# Patient Record
Sex: Male | Born: 1965 | Race: White | Hispanic: No | Marital: Married | Smoking: Never smoker
Health system: Southern US, Community
[De-identification: ages and names within clinical notes are randomized; demographics above are authoritative.]

## PROBLEM LIST (undated history)

## (undated) DIAGNOSIS — I10 Essential (primary) hypertension: Secondary | ICD-10-CM

---

## 1998-08-20 ENCOUNTER — Ambulatory Visit (HOSPITAL_COMMUNITY): Admission: RE | Admit: 1998-08-20 | Discharge: 1998-08-20 | Payer: Self-pay | Admitting: Family Medicine

## 1998-08-20 ENCOUNTER — Encounter: Payer: Self-pay | Admitting: Family Medicine

## 2006-10-26 ENCOUNTER — Encounter: Admission: RE | Admit: 2006-10-26 | Discharge: 2006-10-26 | Payer: Self-pay | Admitting: Nephrology

## 2008-02-02 ENCOUNTER — Emergency Department (HOSPITAL_COMMUNITY): Admission: EM | Admit: 2008-02-02 | Discharge: 2008-02-02 | Payer: Self-pay | Admitting: Family Medicine

## 2008-05-17 IMAGING — CR DG WRIST*R* EXAM
1 series · 3 of 3 positions shown · non-contrast
Comparison: NONE

CLINICAL DATA: Pain and swelling 

RIGHT WRIST

[Series 1: view not recorded · 0.17mm/px · 3 of 3 slices shown]
[im 1/3]
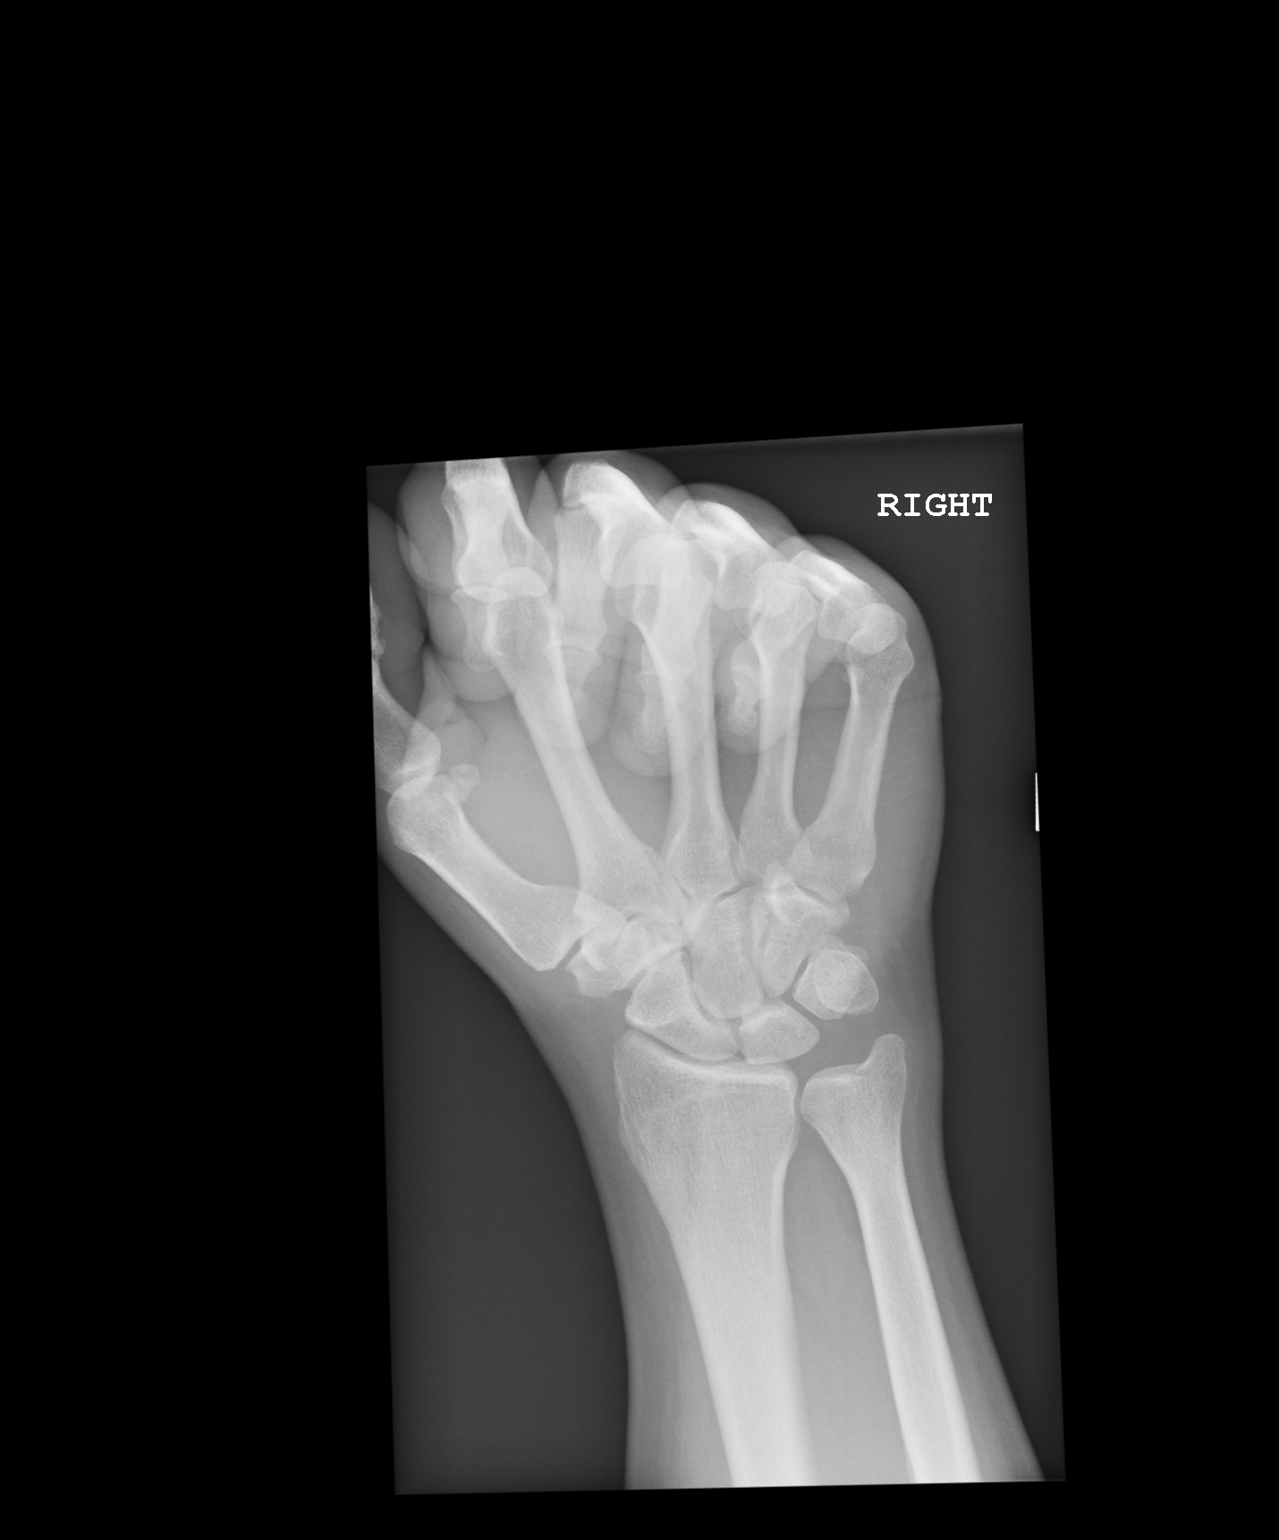
[im 2/3]
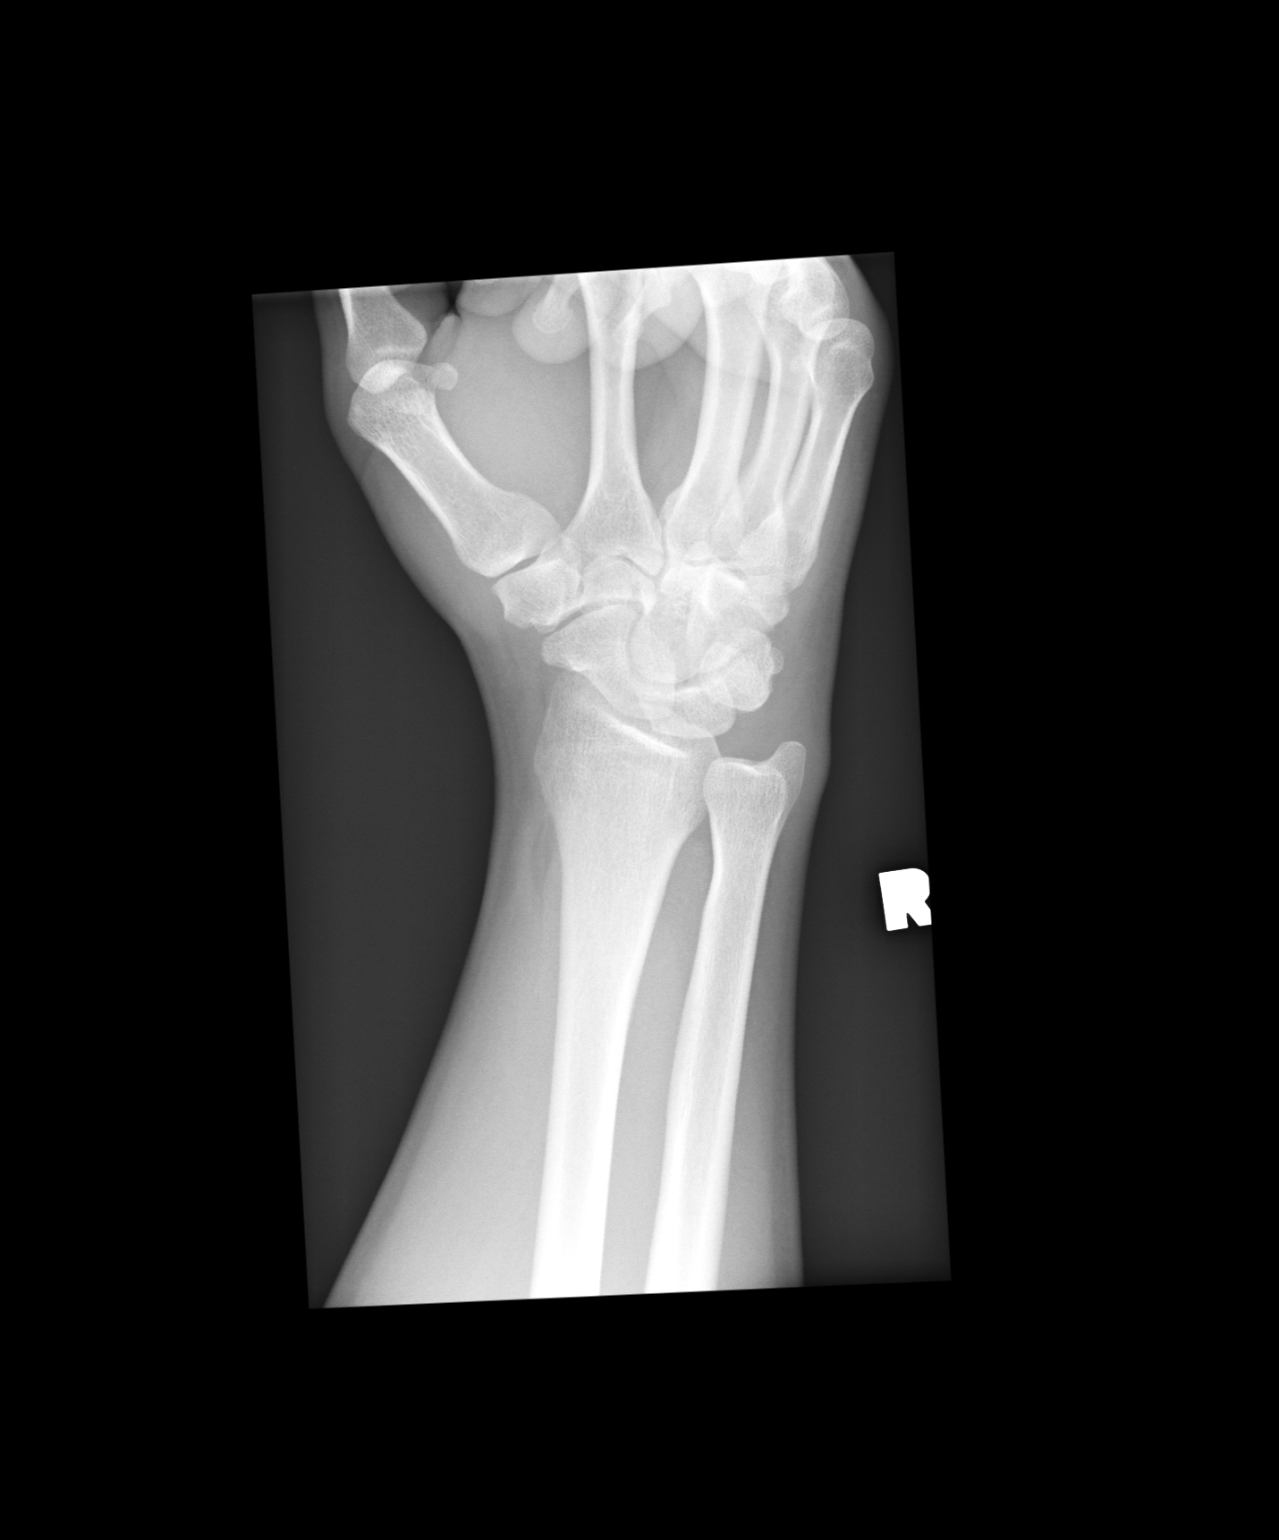
[im 3/3]
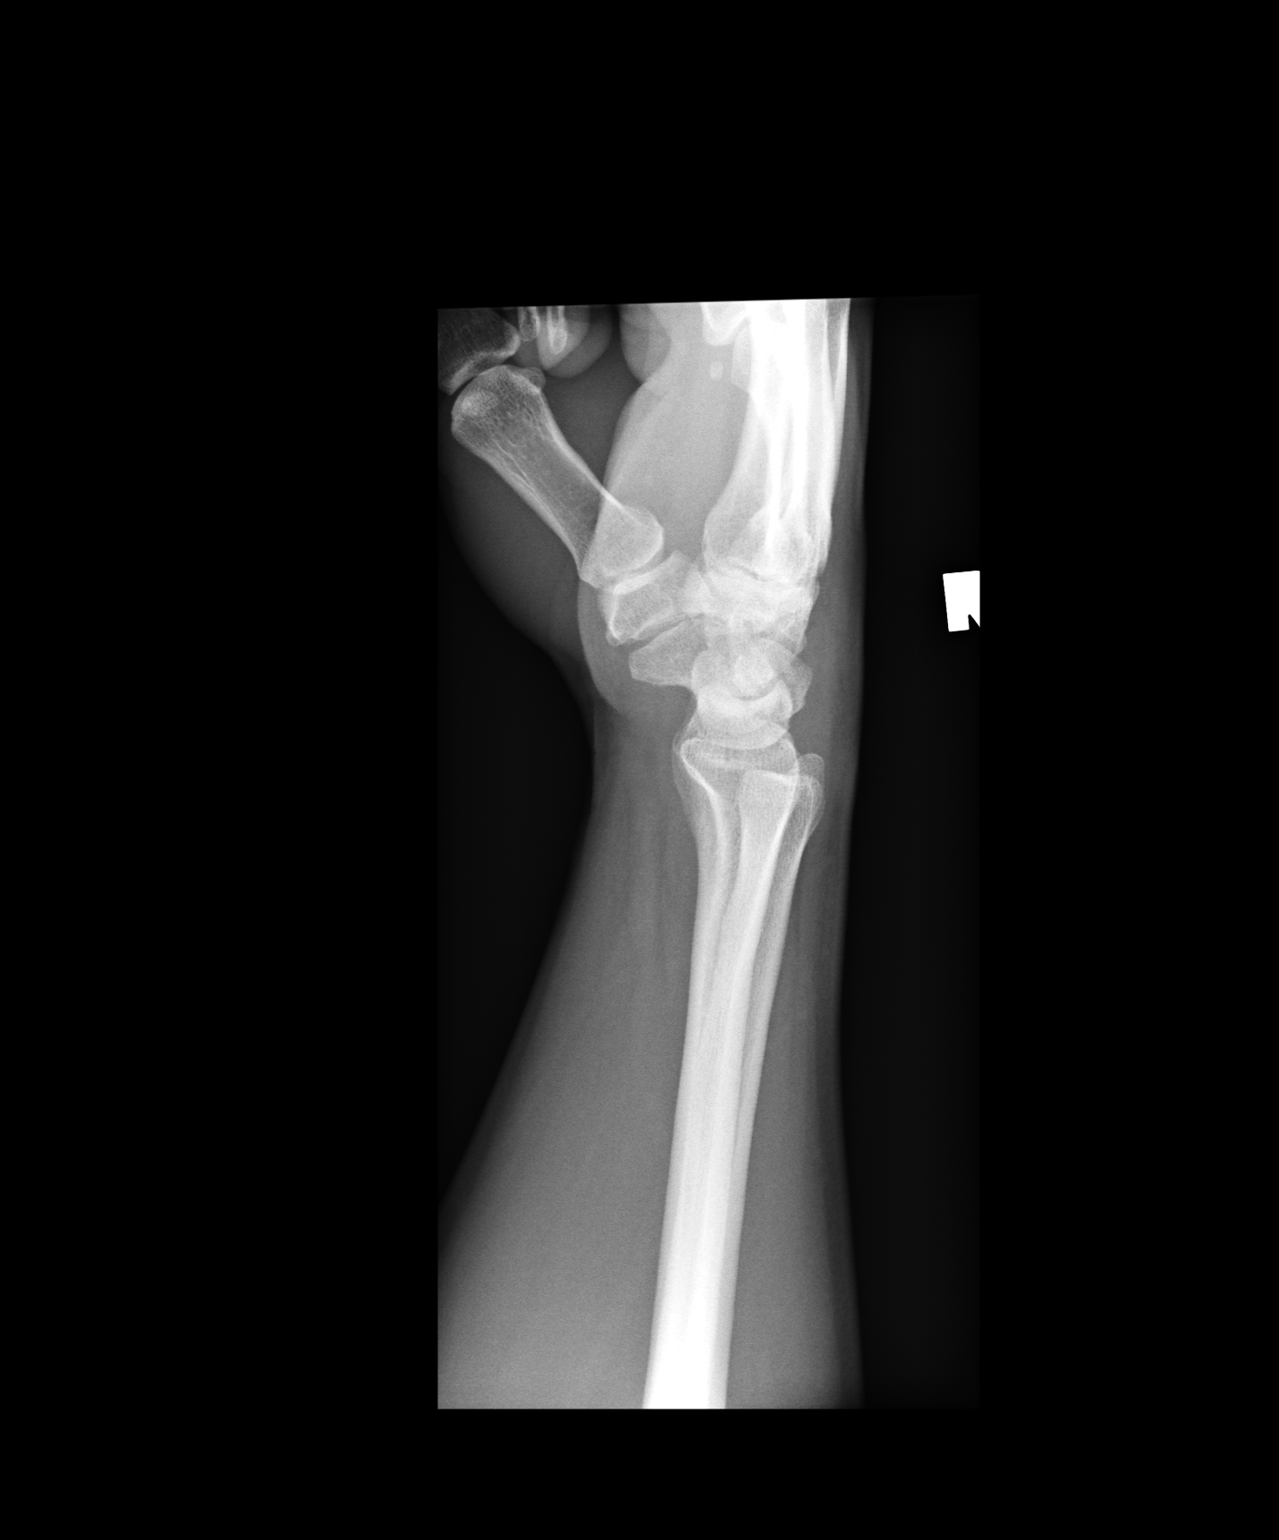

[3 of 3 positions shown; findings below may reference images not displayed]

FINDINGS: The articular spaces are within normal limits. there is 
no evidence of acute fracture or dislocation. No radiopaque 
foreign bodies are seen.
IMPRESSION: Right wrist radiographs are within normal limits. 
Date: 09/21/2006  Tran Date: 09/21/2006 DAS  [REDACTED]

## 2010-06-19 HISTORY — PX: HERNIA REPAIR: SHX51

## 2022-01-14 ENCOUNTER — Emergency Department (HOSPITAL_COMMUNITY): Payer: No Typology Code available for payment source

## 2022-01-14 ENCOUNTER — Inpatient Hospital Stay (HOSPITAL_COMMUNITY): Payer: No Typology Code available for payment source

## 2022-01-14 ENCOUNTER — Inpatient Hospital Stay (HOSPITAL_COMMUNITY)
Admission: EM | Admit: 2022-01-14 | Discharge: 2022-01-25 | DRG: 957 | Disposition: A | Discharge status: Rehabilitation Facility | Payer: No Typology Code available for payment source | Attending: Surgery | Admitting: Surgery

## 2022-01-14 DIAGNOSIS — R102 Pelvic and perineal pain: Secondary | ICD-10-CM | POA: Diagnosis present

## 2022-01-14 DIAGNOSIS — N179 Acute kidney failure, unspecified: Secondary | ICD-10-CM | POA: Diagnosis not present

## 2022-01-14 DIAGNOSIS — D62 Acute posthemorrhagic anemia: Secondary | ICD-10-CM | POA: Diagnosis present

## 2022-01-14 DIAGNOSIS — S52372A Galeazzi's fracture of left radius, initial encounter for closed fracture: Secondary | ICD-10-CM | POA: Diagnosis present

## 2022-01-14 DIAGNOSIS — S329XXA Fracture of unspecified parts of lumbosacral spine and pelvis, initial encounter for closed fracture: Secondary | ICD-10-CM | POA: Diagnosis present

## 2022-01-14 DIAGNOSIS — Z23 Encounter for immunization: Secondary | ICD-10-CM | POA: Diagnosis not present

## 2022-01-14 DIAGNOSIS — I129 Hypertensive chronic kidney disease with stage 1 through stage 4 chronic kidney disease, or unspecified chronic kidney disease: Secondary | ICD-10-CM | POA: Diagnosis present

## 2022-01-14 DIAGNOSIS — S3210XA Unspecified fracture of sacrum, initial encounter for closed fracture: Secondary | ICD-10-CM

## 2022-01-14 DIAGNOSIS — N189 Chronic kidney disease, unspecified: Secondary | ICD-10-CM | POA: Diagnosis present

## 2022-01-14 DIAGNOSIS — K5900 Constipation, unspecified: Secondary | ICD-10-CM | POA: Diagnosis not present

## 2022-01-14 DIAGNOSIS — S32810D Multiple fractures of pelvis with stable disruption of pelvic ring, subsequent encounter for fracture with routine healing: Secondary | ICD-10-CM | POA: Diagnosis not present

## 2022-01-14 DIAGNOSIS — F32A Depression, unspecified: Secondary | ICD-10-CM | POA: Diagnosis not present

## 2022-01-14 DIAGNOSIS — S334XXA Traumatic rupture of symphysis pubis, initial encounter: Secondary | ICD-10-CM

## 2022-01-14 DIAGNOSIS — I10 Essential (primary) hypertension: Secondary | ICD-10-CM | POA: Diagnosis not present

## 2022-01-14 DIAGNOSIS — Z472 Encounter for removal of internal fixation device: Secondary | ICD-10-CM | POA: Diagnosis not present

## 2022-01-14 DIAGNOSIS — S3983XA Other specified injuries of pelvis, initial encounter: Secondary | ICD-10-CM | POA: Diagnosis not present

## 2022-01-14 DIAGNOSIS — R339 Retention of urine, unspecified: Secondary | ICD-10-CM | POA: Diagnosis not present

## 2022-01-14 DIAGNOSIS — S32810A Multiple fractures of pelvis with stable disruption of pelvic ring, initial encounter for closed fracture: Secondary | ICD-10-CM | POA: Diagnosis present

## 2022-01-14 DIAGNOSIS — Z885 Allergy status to narcotic agent status: Secondary | ICD-10-CM | POA: Diagnosis not present

## 2022-01-14 DIAGNOSIS — R000 Tachycardia, unspecified: Secondary | ICD-10-CM | POA: Diagnosis not present

## 2022-01-14 DIAGNOSIS — R578 Other shock: Secondary | ICD-10-CM | POA: Diagnosis present

## 2022-01-14 DIAGNOSIS — S32409S Unspecified fracture of unspecified acetabulum, sequela: Secondary | ICD-10-CM | POA: Diagnosis not present

## 2022-01-14 DIAGNOSIS — S62102A Fracture of unspecified carpal bone, left wrist, initial encounter for closed fracture: Secondary | ICD-10-CM

## 2022-01-14 DIAGNOSIS — S32599A Other specified fracture of unspecified pubis, initial encounter for closed fracture: Secondary | ICD-10-CM

## 2022-01-14 DIAGNOSIS — S3289XA Fracture of other parts of pelvis, initial encounter for closed fracture: Secondary | ICD-10-CM | POA: Diagnosis not present

## 2022-01-14 DIAGNOSIS — S52692A Other fracture of lower end of left ulna, initial encounter for closed fracture: Secondary | ICD-10-CM | POA: Diagnosis present

## 2022-01-14 DIAGNOSIS — S32121A Minimally displaced Zone II fracture of sacrum, initial encounter for closed fracture: Secondary | ICD-10-CM | POA: Diagnosis present

## 2022-01-14 DIAGNOSIS — D72829 Elevated white blood cell count, unspecified: Secondary | ICD-10-CM | POA: Diagnosis not present

## 2022-01-14 DIAGNOSIS — K5901 Slow transit constipation: Secondary | ICD-10-CM | POA: Diagnosis not present

## 2022-01-14 DIAGNOSIS — T07XXXA Unspecified multiple injuries, initial encounter: Secondary | ICD-10-CM | POA: Diagnosis not present

## 2022-01-14 DIAGNOSIS — G8918 Other acute postprocedural pain: Secondary | ICD-10-CM | POA: Diagnosis not present

## 2022-01-14 DIAGNOSIS — E785 Hyperlipidemia, unspecified: Secondary | ICD-10-CM | POA: Diagnosis not present

## 2022-01-14 DIAGNOSIS — S36892A Contusion of other intra-abdominal organs, initial encounter: Secondary | ICD-10-CM | POA: Diagnosis present

## 2022-01-14 DIAGNOSIS — S3282XA Multiple fractures of pelvis without disruption of pelvic ring, initial encounter for closed fracture: Secondary | ICD-10-CM | POA: Diagnosis present

## 2022-01-14 DIAGNOSIS — E876 Hypokalemia: Secondary | ICD-10-CM | POA: Diagnosis not present

## 2022-01-14 DIAGNOSIS — Z880 Allergy status to penicillin: Secondary | ICD-10-CM

## 2022-01-14 HISTORY — DX: Essential (primary) hypertension: I10

## 2022-01-14 LAB — COMPREHENSIVE METABOLIC PANEL
ALT: 65 U/L — ABNORMAL HIGH (ref 0–44)
AST: 72 U/L — ABNORMAL HIGH (ref 15–41)
Albumin: 3.7 g/dL (ref 3.5–5.0)
Alkaline Phosphatase: 55 U/L (ref 38–126)
Anion gap: 11 (ref 5–15)
BUN: 23 mg/dL — ABNORMAL HIGH (ref 6–20)
CO2: 24 mmol/L (ref 22–32)
Calcium: 9.1 mg/dL (ref 8.9–10.3)
Chloride: 103 mmol/L (ref 98–111)
Creatinine, Ser: 2.13 mg/dL — ABNORMAL HIGH (ref 0.61–1.24)
GFR, Estimated: 36 mL/min — ABNORMAL LOW (ref >60)
Glucose, Bld: 134 mg/dL — ABNORMAL HIGH (ref 70–99)
Potassium: 4.5 mmol/L (ref 3.5–5.1)
Sodium: 138 mmol/L (ref 135–145)
Total Bilirubin: 1.2 mg/dL (ref 0.3–1.2)
Total Protein: 6.8 g/dL (ref 6.5–8.1)

## 2022-01-14 LAB — I-STAT CHEM 8, ED
BUN: 30 mg/dL — ABNORMAL HIGH (ref 6–20)
Calcium, Ion: 1.15 mmol/L (ref 1.15–1.40)
Chloride: 104 mmol/L (ref 98–111)
Creatinine, Ser: 2.2 mg/dL — ABNORMAL HIGH (ref 0.61–1.24)
Glucose, Bld: 128 mg/dL — ABNORMAL HIGH (ref 70–99)
HCT: 46 % (ref 39.0–52.0)
Hemoglobin: 15.6 g/dL (ref 13.0–17.0)
Potassium: 4.3 mmol/L (ref 3.5–5.1)
Sodium: 140 mmol/L (ref 135–145)
TCO2: 26 mmol/L (ref 22–32)

## 2022-01-14 LAB — CBC
HCT: 46.9 % (ref 39.0–52.0)
Hemoglobin: 16.1 g/dL (ref 13.0–17.0)
MCH: 31.3 pg (ref 26.0–34.0)
MCHC: 34.3 g/dL (ref 30.0–36.0)
MCV: 91.2 fL (ref 80.0–100.0)
Platelets: 307 10*3/uL (ref 150–400)
RBC: 5.14 MIL/uL (ref 4.22–5.81)
RDW: 13.7 % (ref 11.5–15.5)
WBC: 12.9 10*3/uL — ABNORMAL HIGH (ref 4.0–10.5)
nRBC: 0 % (ref 0.0–0.2)

## 2022-01-14 LAB — URINALYSIS, ROUTINE W REFLEX MICROSCOPIC
Bacteria, UA: NONE SEEN
Bilirubin Urine: NEGATIVE
Glucose, UA: NEGATIVE mg/dL
Ketones, ur: NEGATIVE mg/dL
Leukocytes,Ua: NEGATIVE
Nitrite: NEGATIVE
Protein, ur: 30 mg/dL — AB
Specific Gravity, Urine: 1.03 (ref 1.005–1.030)
pH: 6 (ref 5.0–8.0)

## 2022-01-14 LAB — LACTIC ACID, PLASMA: Lactic Acid, Venous: 3.3 mmol/L (ref 0.5–1.9)

## 2022-01-14 LAB — PROTIME-INR
INR: 1.1 (ref 0.8–1.2)
Prothrombin Time: 13.9 seconds (ref 11.4–15.2)

## 2022-01-14 LAB — ETHANOL: Alcohol, Ethyl (B): <10 mg/dL (ref <10)

## 2022-01-14 LAB — MRSA NEXT GEN BY PCR, NASAL: MRSA by PCR Next Gen: NOT DETECTED

## 2022-01-14 LAB — PREPARE RBC (CROSSMATCH)

## 2022-01-14 MED ORDER — IOHEXOL 300 MG/ML  SOLN
50.0000 mL | Freq: Once | INTRAMUSCULAR | Status: AC | PRN
  Administered 2022-01-14: 50 mL

## 2022-01-14 MED ORDER — CHLORHEXIDINE GLUCONATE CLOTH 2 % EX PADS
6.0000 | MEDICATED_PAD | Freq: Every day | CUTANEOUS | Status: DC
  Administered 2022-01-15 – 2022-01-25 (×9): 6 via TOPICAL

## 2022-01-14 MED ORDER — TETANUS-DIPHTH-ACELL PERTUSSIS 5-2.5-18.5 LF-MCG/0.5 IM SUSY
0.5000 mL | PREFILLED_SYRINGE | Freq: Once | INTRAMUSCULAR | Status: AC
Start: 2022-01-14 — End: 2022-01-14
  Administered 2022-01-14: 0.5 mL via INTRAMUSCULAR

## 2022-01-14 MED ORDER — DEXTROSE-NACL 5-0.45 % IV SOLN
INTRAVENOUS | Status: DC
Start: 2022-01-14 — End: 2022-01-16

## 2022-01-14 MED ORDER — TRANEXAMIC ACID-NACL 1000-0.7 MG/100ML-% IV SOLN
1000.0000 mg | Freq: Once | INTRAVENOUS | Status: AC
  Administered 2022-01-14: 1000 mg via INTRAVENOUS

## 2022-01-14 MED ORDER — FENTANYL CITRATE PF 50 MCG/ML IJ SOSY
50.0000 ug | PREFILLED_SYRINGE | INTRAMUSCULAR | Status: DC | PRN
  Administered 2022-01-14 – 2022-01-15 (×3): 50 ug via INTRAVENOUS
  Administered 2022-01-15 (×3): 100 ug via INTRAVENOUS
  Administered 2022-01-15: 50 ug via INTRAVENOUS
  Administered 2022-01-15 (×6): 100 ug via INTRAVENOUS
  Administered 2022-01-15: 50 ug via INTRAVENOUS
  Administered 2022-01-16: 100 ug via INTRAVENOUS
  Filled 2022-01-14: qty 1
  Filled 2022-01-14 (×5): qty 2
  Filled 2022-01-14: qty 1
  Filled 2022-01-14 (×2): qty 2
  Filled 2022-01-14 (×2): qty 1
  Filled 2022-01-14 (×2): qty 2
  Filled 2022-01-14: qty 1
  Filled 2022-01-14: qty 2

## 2022-01-14 MED ORDER — LIDOCAINE HCL 2 % IJ SOLN
INTRAMUSCULAR | Status: AC
  Filled 2022-01-14: qty 20

## 2022-01-14 MED ORDER — FENTANYL CITRATE PF 50 MCG/ML IJ SOSY
PREFILLED_SYRINGE | INTRAMUSCULAR | Status: AC
  Administered 2022-01-14: 12.5 ug via INTRAVENOUS
  Filled 2022-01-14: qty 1

## 2022-01-14 MED ORDER — METOPROLOL TARTRATE 5 MG/5ML IV SOLN: 5.0000 mg | Freq: Four times a day (QID) | INTRAVENOUS | Status: DC | PRN

## 2022-01-14 MED ORDER — ONDANSETRON HCL 4 MG/2ML IJ SOLN
4.0000 mg | Freq: Four times a day (QID) | INTRAMUSCULAR | Status: DC | PRN
  Administered 2022-01-14 – 2022-01-18 (×5): 4 mg via INTRAVENOUS
  Filled 2022-01-14 (×5): qty 2

## 2022-01-14 MED ORDER — LIDOCAINE HCL (PF) 1 % IJ SOLN
INTRAMUSCULAR | Status: AC
  Filled 2022-01-14: qty 30

## 2022-01-14 MED ORDER — MORPHINE SULFATE (PF) 2 MG/ML IV SOLN
2.0000 mg | INTRAVENOUS | Status: DC | PRN
  Administered 2022-01-14: 4 mg via INTRAVENOUS
  Filled 2022-01-14: qty 2

## 2022-01-14 MED ORDER — LACTATED RINGERS IV BOLUS
1000.0000 mL | Freq: Once | INTRAVENOUS | Status: AC
  Administered 2022-01-14: 1000 mL via INTRAVENOUS

## 2022-01-14 MED ORDER — FENTANYL CITRATE (PF) 100 MCG/2ML IJ SOLN
INTRAMUSCULAR | Status: AC
  Administered 2022-01-14: 100 ug
  Filled 2022-01-14: qty 2

## 2022-01-14 MED ORDER — FENTANYL CITRATE PF 50 MCG/ML IJ SOSY: 12.5000 ug | PREFILLED_SYRINGE | Freq: Once | INTRAMUSCULAR | Status: AC

## 2022-01-14 MED ORDER — ACETAMINOPHEN 325 MG PO TABS
650.0000 mg | ORAL_TABLET | ORAL | Status: DC | PRN
  Administered 2022-01-15: 650 mg via ORAL
  Filled 2022-01-14: qty 2

## 2022-01-14 MED ORDER — CLINDAMYCIN PHOSPHATE 600 MG/50ML IV SOLN
600.0000 mg | Freq: Once | INTRAVENOUS | Status: AC
  Administered 2022-01-14: 600 mg via INTRAVENOUS
  Filled 2022-01-14: qty 50

## 2022-01-14 MED ORDER — PROCHLORPERAZINE EDISYLATE 10 MG/2ML IJ SOLN
10.0000 mg | Freq: Four times a day (QID) | INTRAMUSCULAR | Status: DC | PRN
  Administered 2022-01-14: 10 mg via INTRAVENOUS
  Filled 2022-01-14 (×2): qty 2

## 2022-01-14 MED ORDER — SODIUM CHLORIDE 0.9% IV SOLUTION: Freq: Once | INTRAVENOUS | Status: DC

## 2022-01-14 MED ORDER — ONDANSETRON 4 MG PO TBDP: 4.0000 mg | ORAL_TABLET | Freq: Four times a day (QID) | ORAL | Status: DC | PRN

## 2022-01-14 MED ORDER — IOHEXOL 350 MG/ML SOLN
100.0000 mL | Freq: Once | INTRAVENOUS | Status: AC | PRN
  Administered 2022-01-14: 100 mL via INTRAVENOUS

## 2022-01-14 MED ORDER — PANTOPRAZOLE SODIUM 40 MG IV SOLR
40.0000 mg | Freq: Every day | INTRAVENOUS | Status: DC
  Administered 2022-01-15: 40 mg via INTRAVENOUS
  Filled 2022-01-14: qty 10

## 2022-01-14 MED ORDER — DOCUSATE SODIUM 100 MG PO CAPS
100.0000 mg | ORAL_CAPSULE | Freq: Two times a day (BID) | ORAL | Status: DC
Start: 2022-01-14 — End: 2022-01-25
  Administered 2022-01-16 – 2022-01-25 (×18): 100 mg via ORAL
  Filled 2022-01-14 (×18): qty 1

## 2022-01-14 MED ORDER — OXYCODONE HCL 5 MG PO TABS
5.0000 mg | ORAL_TABLET | ORAL | Status: DC | PRN
  Administered 2022-01-15 – 2022-01-16 (×2): 5 mg via ORAL
  Filled 2022-01-14 (×2): qty 1

## 2022-01-14 MED ORDER — ONDANSETRON HCL 4 MG/2ML IJ SOLN
INTRAMUSCULAR | Status: AC
  Administered 2022-01-14: 4 mg via INTRAVENOUS
  Filled 2022-01-14: qty 2

## 2022-01-14 MED ORDER — PANTOPRAZOLE SODIUM 40 MG PO TBEC: 40.0000 mg | DELAYED_RELEASE_TABLET | Freq: Every day | ORAL | Status: DC

## 2022-01-14 NOTE — Progress Notes (Signed)
Attempted to get report from ED at 1951.
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
## 2022-01-14 NOTE — ED Notes (Signed)
Pt SpO2 89% on RA, pt placed on 2L via nasal cannula. BP decreased to 89 systolic.

## 2022-01-14 NOTE — Progress Notes (Signed)
Trauma Event Note    Obtained new orders for pain/nausea (D/C morphine, add PRN fentanyl and compazine). Current plan to keep in pelvic binder until Monday. Maintain NPO overnight d/t nausea this evening.     Norman Crawford  Trauma Response RN  Please call TRN at [phone] for further assistance.
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
## 2022-01-14 NOTE — Progress Notes (Signed)
Trauma response RN on unit. She has been communicating with MD. This RN relayed to her that patient is in pain, but currently refusing IV morphine d/t nausea. Messaged relayed to MD per Trauma response RN. Also requested another antiemetic. Will continue to monitor.
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
## 2022-01-14 NOTE — Progress Notes (Signed)
Orthopedic Tech Progress Note Patient Details:  Norman Crawford Apr 01, 1966 [id number]  Ortho Devices Type of Ortho Device: Sugartong splint Ortho Device/Splint Location: lue  I assisted ortho dr with splint. Post Interventions Patient Tolerated: Well Instructions Provided: Care of device  Trinna Post 01/14/2022, 8:34 PM

## 2022-01-14 NOTE — Progress Notes (Signed)
Upon admission to unit, patient's underwear entrapped in pelvic binder. Seen coming out top and bottom of binder. Unable to retrieve without undoing binder.
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
## 2022-01-14 NOTE — Progress Notes (Signed)
Pt had episode of n/v earlier States he feels better in his abd  BP 96/66   Pulse 89   Temp 98 F (36.7 C) (Axillary)   Resp 17   Ht 5\' 9"  (1.753 m)   Wt 115.7 kg   SpO2 96%   BMI 37.66 kg/m   Resting comfortably, alert, ox4 Significant other at [facility] Obese wm, obese abd, soft, nontender, no guarding/rebounding  We are monitoring his abdomen given mesentery hematomas. No overt signs of intestinal injury on admit ct.  Abd exam reassuring right now D/w pt potential for exp lap should he have increasing abd pain to r/o intestinal injury Discussed that likely blood in abd is irritating intestines and may give rise to ileus/peritoneal irritation.   Npo xcept ice chips  No neck pain; no pain on palpation of c spine, FROM c spine without pain - dc c collar and c spine precautions  [facility]. Mary Sella, MD, FACS General, Bariatric, & Minimally Invasive Surgery [facility], [facility]

## 2022-01-15 ENCOUNTER — Inpatient Hospital Stay: Payer: Self-pay

## 2022-01-15 ENCOUNTER — Encounter (HOSPITAL_COMMUNITY): Payer: Self-pay | Admitting: *Deleted

## 2022-01-15 LAB — CBC
HCT: 40.1 % (ref 39.0–52.0)
HCT: 39 % (ref 39.0–52.0)
HCT: 37.5 % — ABNORMAL LOW (ref 39.0–52.0)
Hemoglobin: 13.8 g/dL (ref 13.0–17.0)
Hemoglobin: 13.3 g/dL (ref 13.0–17.0)
Hemoglobin: 13 g/dL (ref 13.0–17.0)
MCH: 31.2 pg (ref 26.0–34.0)
MCH: 30.6 pg (ref 26.0–34.0)
MCH: 31.6 pg (ref 26.0–34.0)
MCHC: 34.4 g/dL (ref 30.0–36.0)
MCHC: 34.1 g/dL (ref 30.0–36.0)
MCHC: 34.7 g/dL (ref 30.0–36.0)
MCV: 90.5 fL (ref 80.0–100.0)
MCV: 89.9 fL (ref 80.0–100.0)
MCV: 91.2 fL (ref 80.0–100.0)
Platelets: 166 10*3/uL (ref 150–400)
Platelets: 145 10*3/uL — ABNORMAL LOW (ref 150–400)
Platelets: 148 10*3/uL — ABNORMAL LOW (ref 150–400)
RBC: 4.43 MIL/uL (ref 4.22–5.81)
RBC: 4.34 MIL/uL (ref 4.22–5.81)
RBC: 4.11 MIL/uL — ABNORMAL LOW (ref 4.22–5.81)
RDW: 14.5 % (ref 11.5–15.5)
RDW: 14.4 % (ref 11.5–15.5)
RDW: 14.3 % (ref 11.5–15.5)
WBC: 11.5 10*3/uL — ABNORMAL HIGH (ref 4.0–10.5)
WBC: 12.6 10*3/uL — ABNORMAL HIGH (ref 4.0–10.5)
WBC: 10.5 10*3/uL (ref 4.0–10.5)
nRBC: 0 % (ref 0.0–0.2)
nRBC: 0 % (ref 0.0–0.2)
nRBC: 0 % (ref 0.0–0.2)

## 2022-01-15 LAB — TYPE AND SCREEN
ABO/RH(D): A POS
Antibody Screen: NEGATIVE
Unit division: 0
Unit division: 0

## 2022-01-15 LAB — BPAM RBC
Blood Product Expiration Date: 202308232359
Blood Product Expiration Date: 202308252359
ISSUE DATE / TIME: 202307291741
ISSUE DATE / TIME: 202307291759
Unit Type and Rh: 5100
Unit Type and Rh: 5100

## 2022-01-15 LAB — ABO/RH: ABO/RH(D): A POS

## 2022-01-15 LAB — BLOOD PRODUCT ORDER (VERBAL) VERIFICATION

## 2022-01-15 MED ORDER — SODIUM CHLORIDE 0.9 % IV SOLN
25.0000 mg | Freq: Four times a day (QID) | INTRAVENOUS | Status: DC | PRN
  Administered 2022-01-15: 25 mg via INTRAVENOUS
  Filled 2022-01-15 (×2): qty 1

## 2022-01-15 MED ORDER — SODIUM CHLORIDE 0.9% FLUSH: 10.0000 mL | INTRAVENOUS | Status: DC | PRN

## 2022-01-15 MED ORDER — SODIUM CHLORIDE 0.9% FLUSH
10.0000 mL | Freq: Two times a day (BID) | INTRAVENOUS | Status: DC
  Administered 2022-01-15 – 2022-01-18 (×7): 10 mL
  Administered 2022-01-19: 20 mL
  Administered 2022-01-19 – 2022-01-24 (×10): 10 mL
  Administered 2022-01-24: 20 mL
  Administered 2022-01-25: 10 mL

## 2022-01-15 NOTE — Progress Notes (Signed)
Dr. Andrey Campanile paged. Phlebotomy has been unable to draw labs. Per phlebotomy, patient is a very difficult draw.  Per phlebotomy, with the amount they have tried they are no longer allowed to attempt to draw labs from the patient for 24 hours. Dr. Andrey Campanile made aware.  Verbal orders to have IV team come and place a PICC line.
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
## 2022-01-15 NOTE — Progress Notes (Signed)
OT Cancellation Note  Patient Details Name: Norman Crawford MRN: [id number] DOB: 1965/12/10   Cancelled Treatment:    Reason Eval/Treat Not Completed: Patient not medically ready (Plan for surgery Monday, OT evaluation to f/u when appropriate)  [name] 01/15/2022, 7:24 AM
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
## 2022-01-15 NOTE — Progress Notes (Signed)
PT Cancellation Note  Patient Details Name: Norman Crawford MRN: [id number] DOB: 19-Mar-1966   Cancelled Treatment:    Reason Eval/Treat Not Completed: Patient not medically ready (note plans for surgery on Monday. Will assess when pt is medically appropriate to participate in physical therapy evaluation).  Lillia Pauls, PT, DPT Acute Rehabilitation Services Office [phone]    [name] 01/15/2022, 7:21 AM

## 2022-01-15 NOTE — Progress Notes (Signed)
Peripherally Inserted Central Catheter Placement  The IV Nurse has discussed with the patient and/or persons authorized to consent for the patient, the purpose of this procedure and the potential benefits and risks involved with this procedure.  The benefits include less needle sticks, lab draws from the catheter, and the patient may be discharged home with the catheter. Risks include, but not limited to, infection, bleeding, blood clot (thrombus formation), and puncture of an artery; nerve damage and irregular heartbeat and possibility to perform a PICC exchange if needed/ordered by physician.  Alternatives to this procedure were also discussed.  Bard Power PICC patient education guide, fact sheet on infection prevention and patient information card has been provided to patient /or left at bedside.    PICC Placement Documentation  PICC Double Lumen 01/15/22 Right Cephalic 41 cm 0 cm (Active)  Indication for Insertion or Continuance of Line Limited venous access - need for IV therapy >5 days (PICC only) 01/15/22 1059  Exposed Catheter (cm) 0 cm 01/15/22 1059  Site Assessment Clean, Dry, Intact 01/15/22 1059  Lumen #1 Status Flushed;Saline locked;Blood return noted 01/15/22 1059  Lumen #2 Status Flushed;Saline locked;Blood return noted 01/15/22 1059  Dressing Type Transparent;Securing device 01/15/22 1059  Dressing Status Antimicrobial disc in place 01/15/22 1059  Safety Lock Not Applicable 01/15/22 1059  Line Care Connections checked and tightened 01/15/22 1059  Line Adjustment (NICU/IV Team Only) No 01/15/22 1059  Dressing Intervention New dressing 01/15/22 1059  Dressing Change Due 01/21/22 01/15/22 1059       Gennell How 01/15/2022, 11:01 AM

## 2022-01-16 ENCOUNTER — Inpatient Hospital Stay (HOSPITAL_COMMUNITY): Payer: No Typology Code available for payment source

## 2022-01-16 ENCOUNTER — Inpatient Hospital Stay (HOSPITAL_COMMUNITY): Payer: No Typology Code available for payment source | Admitting: Anesthesiology

## 2022-01-16 ENCOUNTER — Other Ambulatory Visit: Payer: Self-pay

## 2022-01-16 ENCOUNTER — Encounter (HOSPITAL_COMMUNITY): Payer: Self-pay

## 2022-01-16 ENCOUNTER — Encounter (HOSPITAL_COMMUNITY): Admission: EM | Disposition: A | Discharge status: Rehabilitation Facility | Payer: Self-pay | Source: Home / Self Care

## 2022-01-16 DIAGNOSIS — S3983XA Other specified injuries of pelvis, initial encounter: Secondary | ICD-10-CM | POA: Diagnosis not present

## 2022-01-16 DIAGNOSIS — S36892A Contusion of other intra-abdominal organs, initial encounter: Secondary | ICD-10-CM

## 2022-01-16 DIAGNOSIS — S52372A Galeazzi's fracture of left radius, initial encounter for closed fracture: Secondary | ICD-10-CM

## 2022-01-16 DIAGNOSIS — S334XXA Traumatic rupture of symphysis pubis, initial encounter: Secondary | ICD-10-CM

## 2022-01-16 HISTORY — PX: ORIF PELVIC FRACTURE: SHX2128

## 2022-01-16 LAB — POCT I-STAT, CHEM 8
BUN: 25 mg/dL — ABNORMAL HIGH (ref 6–20)
Calcium, Ion: 1.13 mmol/L — ABNORMAL LOW (ref 1.15–1.40)
Chloride: 100 mmol/L (ref 98–111)
Creatinine, Ser: 1.9 mg/dL — ABNORMAL HIGH (ref 0.61–1.24)
Glucose, Bld: 106 mg/dL — ABNORMAL HIGH (ref 70–99)
HCT: 32 % — ABNORMAL LOW (ref 39.0–52.0)
Hemoglobin: 10.9 g/dL — ABNORMAL LOW (ref 13.0–17.0)
Potassium: 3.9 mmol/L (ref 3.5–5.1)
Sodium: 136 mmol/L (ref 135–145)
TCO2: 24 mmol/L (ref 22–32)

## 2022-01-16 LAB — BASIC METABOLIC PANEL
Anion gap: 5 (ref 5–15)
BUN: 21 mg/dL — ABNORMAL HIGH (ref 6–20)
CO2: 24 mmol/L (ref 22–32)
Calcium: 7.6 mg/dL — ABNORMAL LOW (ref 8.9–10.3)
Chloride: 103 mmol/L (ref 98–111)
Creatinine, Ser: 1.49 mg/dL — ABNORMAL HIGH (ref 0.61–1.24)
GFR, Estimated: 55 mL/min — ABNORMAL LOW (ref >60)
Glucose, Bld: 408 mg/dL — ABNORMAL HIGH (ref 70–99)
Potassium: 3.4 mmol/L — ABNORMAL LOW (ref 3.5–5.1)
Sodium: 132 mmol/L — ABNORMAL LOW (ref 135–145)

## 2022-01-16 LAB — CBC
HCT: 36.3 % — ABNORMAL LOW (ref 39.0–52.0)
Hemoglobin: 12.2 g/dL — ABNORMAL LOW (ref 13.0–17.0)
MCH: 31.3 pg (ref 26.0–34.0)
MCHC: 33.6 g/dL (ref 30.0–36.0)
MCV: 93.1 fL (ref 80.0–100.0)
Platelets: 138 10*3/uL — ABNORMAL LOW (ref 150–400)
RBC: 3.9 MIL/uL — ABNORMAL LOW (ref 4.22–5.81)
RDW: 14.3 % (ref 11.5–15.5)
WBC: 9.3 10*3/uL (ref 4.0–10.5)
nRBC: 0 % (ref 0.0–0.2)

## 2022-01-16 LAB — GLUCOSE, CAPILLARY: Glucose-Capillary: 116 mg/dL — ABNORMAL HIGH (ref 70–99)

## 2022-01-16 LAB — SURGICAL PCR SCREEN
MRSA, PCR: NEGATIVE
Staphylococcus aureus: POSITIVE — AB

## 2022-01-16 LAB — HIV ANTIBODY (ROUTINE TESTING W REFLEX): HIV Screen 4th Generation wRfx: NONREACTIVE

## 2022-01-16 SURGERY — OPEN REDUCTION INTERNAL FIXATION (ORIF) PELVIC FRACTURE
Anesthesia: General | Laterality: Bilateral

## 2022-01-16 MED ORDER — KETAMINE HCL 10 MG/ML IJ SOLN
INTRAMUSCULAR | Status: DC | PRN
  Administered 2022-01-16: 30 mg via INTRAVENOUS
  Administered 2022-01-16: 20 mg via INTRAVENOUS

## 2022-01-16 MED ORDER — LIDOCAINE 2% (20 MG/ML) 5 ML SYRINGE
INTRAMUSCULAR | Status: DC | PRN
  Administered 2022-01-16: 80 mg via INTRAVENOUS

## 2022-01-16 MED ORDER — ONDANSETRON HCL 4 MG/2ML IJ SOLN
INTRAMUSCULAR | Status: DC | PRN
  Administered 2022-01-16: 4 mg via INTRAVENOUS

## 2022-01-16 MED ORDER — FENTANYL CITRATE (PF) 100 MCG/2ML IJ SOLN: 25.0000 ug | INTRAMUSCULAR | Status: DC | PRN

## 2022-01-16 MED ORDER — FENTANYL CITRATE (PF) 250 MCG/5ML IJ SOLN
INTRAMUSCULAR | Status: DC | PRN
  Administered 2022-01-16 (×2): 50 ug via INTRAVENOUS
  Administered 2022-01-16: 100 ug via INTRAVENOUS

## 2022-01-16 MED ORDER — CEFAZOLIN SODIUM-DEXTROSE 2-4 GM/100ML-% IV SOLN
2.0000 g | INTRAVENOUS | Status: AC
  Administered 2022-01-16: 2 g via INTRAVENOUS

## 2022-01-16 MED ORDER — FENTANYL CITRATE (PF) 250 MCG/5ML IJ SOLN
INTRAMUSCULAR | Status: AC
  Filled 2022-01-16: qty 5

## 2022-01-16 MED ORDER — KETAMINE HCL 50 MG/5ML IJ SOSY
PREFILLED_SYRINGE | INTRAMUSCULAR | Status: AC
  Filled 2022-01-16: qty 5

## 2022-01-16 MED ORDER — ACETAMINOPHEN 500 MG PO TABS
1000.0000 mg | ORAL_TABLET | Freq: Four times a day (QID) | ORAL | Status: DC
  Administered 2022-01-16 – 2022-01-25 (×33): 1000 mg via ORAL
  Filled 2022-01-16 (×31): qty 2

## 2022-01-16 MED ORDER — ALBUMIN HUMAN 5 % IV SOLN: INTRAVENOUS | Status: DC | PRN

## 2022-01-16 MED ORDER — ROCURONIUM BROMIDE 10 MG/ML (PF) SYRINGE
PREFILLED_SYRINGE | INTRAVENOUS | Status: DC | PRN
  Administered 2022-01-16: 100 mg via INTRAVENOUS
  Administered 2022-01-16: 20 mg via INTRAVENOUS

## 2022-01-16 MED ORDER — KETOROLAC TROMETHAMINE 15 MG/ML IJ SOLN
30.0000 mg | Freq: Four times a day (QID) | INTRAMUSCULAR | Status: AC
  Administered 2022-01-16 – 2022-01-21 (×18): 30 mg via INTRAVENOUS
  Filled 2022-01-16 (×18): qty 2

## 2022-01-16 MED ORDER — PROPOFOL 10 MG/ML IV BOLUS
INTRAVENOUS | Status: AC
  Filled 2022-01-16: qty 20

## 2022-01-16 MED ORDER — PROPOFOL 10 MG/ML IV BOLUS
INTRAVENOUS | Status: DC | PRN
  Administered 2022-01-16: 100 mg via INTRAVENOUS

## 2022-01-16 MED ORDER — PHENYLEPHRINE 80 MCG/ML (10ML) SYRINGE FOR IV PUSH (FOR BLOOD PRESSURE SUPPORT)
PREFILLED_SYRINGE | INTRAVENOUS | Status: DC | PRN
  Administered 2022-01-16: 80 ug via INTRAVENOUS
  Administered 2022-01-16: 160 ug via INTRAVENOUS
  Administered 2022-01-16: 80 ug via INTRAVENOUS

## 2022-01-16 MED ORDER — OXYCODONE HCL 5 MG/5ML PO SOLN
5.0000 mg | ORAL | Status: DC | PRN
  Administered 2022-01-17: 10 mg via ORAL
  Filled 2022-01-16: qty 10

## 2022-01-16 MED ORDER — CHLORHEXIDINE GLUCONATE 0.12 % MT SOLN: 15.0000 mL | Freq: Once | OROMUCOSAL | Status: AC

## 2022-01-16 MED ORDER — CEFAZOLIN SODIUM-DEXTROSE 2-4 GM/100ML-% IV SOLN
2.0000 g | Freq: Three times a day (TID) | INTRAVENOUS | Status: AC
  Administered 2022-01-16 – 2022-01-17 (×3): 2 g via INTRAVENOUS
  Filled 2022-01-16 (×3): qty 100

## 2022-01-16 MED ORDER — MIDAZOLAM HCL 2 MG/2ML IJ SOLN
INTRAMUSCULAR | Status: AC
  Filled 2022-01-16: qty 2

## 2022-01-16 MED ORDER — SUGAMMADEX SODIUM 200 MG/2ML IV SOLN
INTRAVENOUS | Status: DC | PRN
  Administered 2022-01-16: 200 mg via INTRAVENOUS

## 2022-01-16 MED ORDER — CHLORHEXIDINE GLUCONATE 4 % EX LIQD: 60.0000 mL | Freq: Once | CUTANEOUS | Status: DC

## 2022-01-16 MED ORDER — POTASSIUM CHLORIDE 10 MEQ/100ML IV SOLN
10.0000 meq | INTRAVENOUS | Status: DC
  Filled 2022-01-16 (×2): qty 100

## 2022-01-16 MED ORDER — POVIDONE-IODINE 10 % EX SWAB
2.0000 | Freq: Once | CUTANEOUS | Status: AC
Start: 2022-01-16 — End: 2022-01-16
  Administered 2022-01-16: 2 via TOPICAL

## 2022-01-16 MED ORDER — VANCOMYCIN HCL 1000 MG IV SOLR
INTRAVENOUS | Status: AC
Start: 2022-01-16 — End: ?
  Filled 2022-01-16: qty 20

## 2022-01-16 MED ORDER — LACTATED RINGERS IV SOLN: INTRAVENOUS | Status: DC

## 2022-01-16 MED ORDER — MIDAZOLAM HCL 2 MG/2ML IJ SOLN
INTRAMUSCULAR | Status: DC | PRN
  Administered 2022-01-16 (×2): 1 mg via INTRAVENOUS

## 2022-01-16 MED ORDER — VANCOMYCIN HCL 1000 MG IV SOLR
INTRAVENOUS | Status: DC | PRN
  Administered 2022-01-16: 1000 mg

## 2022-01-16 MED ORDER — TRANEXAMIC ACID-NACL 1000-0.7 MG/100ML-% IV SOLN
1000.0000 mg | INTRAVENOUS | Status: AC
  Administered 2022-01-16: 1000 mg via INTRAVENOUS

## 2022-01-16 MED ORDER — 0.9 % SODIUM CHLORIDE (POUR BTL) OPTIME
TOPICAL | Status: DC | PRN
  Administered 2022-01-16: 1000 mL

## 2022-01-16 MED ORDER — LACTATED RINGERS IV SOLN
INTRAVENOUS | Status: DC
Start: 2022-01-16 — End: 2022-01-16

## 2022-01-16 MED ORDER — SODIUM CHLORIDE 0.9 % IV BOLUS: 1000.0000 mL | Freq: Once | INTRAVENOUS | Status: DC

## 2022-01-16 MED ORDER — MORPHINE SULFATE (PF) 2 MG/ML IV SOLN
2.0000 mg | INTRAVENOUS | Status: DC | PRN
  Administered 2022-01-16: 2 mg via INTRAVENOUS
  Administered 2022-01-16 – 2022-01-17 (×3): 4 mg via INTRAVENOUS
  Administered 2022-01-17: 2 mg via INTRAVENOUS
  Administered 2022-01-18: 4 mg via INTRAVENOUS
  Administered 2022-01-18: 2 mg via INTRAVENOUS
  Filled 2022-01-16: qty 1
  Filled 2022-01-16 (×3): qty 2
  Filled 2022-01-16: qty 1
  Filled 2022-01-16: qty 2
  Filled 2022-01-16: qty 1

## 2022-01-16 MED ORDER — CHLORHEXIDINE GLUCONATE 0.12 % MT SOLN
OROMUCOSAL | Status: AC
  Administered 2022-01-16: 15 mL via OROMUCOSAL
  Filled 2022-01-16: qty 15

## 2022-01-16 MED ORDER — ATENOLOL 50 MG PO TABS
100.0000 mg | ORAL_TABLET | Freq: Once | ORAL | Status: AC
  Administered 2022-01-16: 100 mg via ORAL
  Filled 2022-01-16: qty 2

## 2022-01-16 MED ORDER — DEXAMETHASONE SODIUM PHOSPHATE 10 MG/ML IJ SOLN
INTRAMUSCULAR | Status: DC | PRN
  Administered 2022-01-16: 10 mg via INTRAVENOUS

## 2022-01-16 MED ORDER — METHOCARBAMOL 500 MG PO TABS
1000.0000 mg | ORAL_TABLET | Freq: Three times a day (TID) | ORAL | Status: DC
  Administered 2022-01-16 – 2022-01-25 (×27): 1000 mg via ORAL
  Filled 2022-01-16 (×27): qty 2

## 2022-01-16 MED ORDER — OXYCODONE HCL 5 MG/5ML PO SOLN
5.0000 mg | ORAL | Status: DC | PRN
  Administered 2022-01-16 (×2): 10 mg
  Filled 2022-01-16 (×2): qty 10

## 2022-01-16 MED ORDER — ORAL CARE MOUTH RINSE: 15.0000 mL | Freq: Once | OROMUCOSAL | Status: AC

## 2022-01-16 MED ORDER — PHENYLEPHRINE HCL-NACL 20-0.9 MG/250ML-% IV SOLN
INTRAVENOUS | Status: DC | PRN
  Administered 2022-01-16: 25 ug/min via INTRAVENOUS

## 2022-01-16 MED ORDER — TRANEXAMIC ACID-NACL 1000-0.7 MG/100ML-% IV SOLN
INTRAVENOUS | Status: AC
  Filled 2022-01-16: qty 100

## 2022-01-16 MED ORDER — CEFAZOLIN SODIUM-DEXTROSE 2-4 GM/100ML-% IV SOLN
INTRAVENOUS | Status: AC
  Filled 2022-01-16: qty 100

## 2022-01-16 SURGICAL SUPPLY — 69 items
ADH SKN CLS APL DERMABOND .7 (GAUZE/BANDAGES/DRESSINGS) ×2
APL PRP STRL LF DISP 70% ISPRP (MISCELLANEOUS) ×1
BAG COUNTER SPONGE SURGICOUNT (BAG) ×2 IMPLANT
BAG SPNG CNTER NS LX DISP (BAG) ×1
BIT DRILL 2.5X300 (BIT) IMPLANT
BIT DRILL CANN 4.5MM (BIT) IMPLANT
BLADE CLIPPER SURG (BLADE) IMPLANT
CHLORAPREP W/TINT 26 (MISCELLANEOUS) ×2 IMPLANT
DERMABOND ADVANCED (GAUZE/BANDAGES/DRESSINGS) ×4
DERMABOND ADVANCED .7 DNX12 (GAUZE/BANDAGES/DRESSINGS) ×2 IMPLANT
DRAIN CHANNEL 15F RND FF W/TCR (WOUND CARE) IMPLANT
DRAPE C-ARM 42X72 X-RAY (DRAPES) ×2 IMPLANT
DRAPE C-ARMOR (DRAPES) ×2 IMPLANT
DRAPE HALF SHEET 40X57 (DRAPES) ×4 IMPLANT
DRAPE INCISE IOBAN 66X45 STRL (DRAPES) ×4 IMPLANT
DRAPE SURG 17X23 STRL (DRAPES) ×12 IMPLANT
DRAPE U-SHAPE 47X51 STRL (DRAPES) ×2 IMPLANT
DRESSING MEPILEX FLEX 4X4 (GAUZE/BANDAGES/DRESSINGS) IMPLANT
DRILL BIT 2.5X300 (BIT) ×4
DRILL BIT CANN 4.5MM (BIT) ×2
DRSG MEPILEX BORDER 4X4 (GAUZE/BANDAGES/DRESSINGS) ×4 IMPLANT
DRSG MEPILEX BORDER 4X8 (GAUZE/BANDAGES/DRESSINGS) ×3 IMPLANT
DRSG MEPILEX FLEX 4X4 (GAUZE/BANDAGES/DRESSINGS) ×2
ELECT REM PT RETURN 9FT ADLT (ELECTROSURGICAL) ×2
ELECTRODE REM PT RTRN 9FT ADLT (ELECTROSURGICAL) ×1 IMPLANT
EVACUATOR SILICONE 100CC (DRAIN) IMPLANT
GLOVE BIO SURGEON STRL SZ 6.5 (GLOVE) ×6 IMPLANT
GLOVE BIO SURGEON STRL SZ7.5 (GLOVE) ×8 IMPLANT
GLOVE BIOGEL PI IND STRL 6.5 (GLOVE) ×1 IMPLANT
GLOVE BIOGEL PI IND STRL 7.5 (GLOVE) ×1 IMPLANT
GLOVE BIOGEL PI INDICATOR 6.5 (GLOVE) ×2
GLOVE BIOGEL PI INDICATOR 7.5 (GLOVE) ×2
GOWN STRL REUS W/ TWL LRG LVL3 (GOWN DISPOSABLE) ×2 IMPLANT
GOWN STRL REUS W/TWL LRG LVL3 (GOWN DISPOSABLE) ×4
GUIDEWIRE 2.0MM (WIRE) ×3 IMPLANT
GUIDEWIRE THREADED 2.8MM (WIRE) ×1 IMPLANT
KIT BASIN OR (CUSTOM PROCEDURE TRAY) ×2 IMPLANT
KIT TURNOVER KIT B (KITS) ×2 IMPLANT
MANIFOLD NEPTUNE II (INSTRUMENTS) ×2 IMPLANT
NS IRRIG 1000ML POUR BTL (IV SOLUTION) ×4 IMPLANT
PACK TOTAL JOINT (CUSTOM PROCEDURE TRAY) ×2 IMPLANT
PACK UNIVERSAL I (CUSTOM PROCEDURE TRAY) ×2 IMPLANT
PAD ARMBOARD 7.5X6 YLW CONV (MISCELLANEOUS) ×4 IMPLANT
PLATE PUBLIC SYMPHOSIS 3.5 (Plate) ×1 IMPLANT
SCREW CANN LOCK FT 7.3X170 (Screw) ×1 IMPLANT
SCREW LOCK CORT ST 3.5X26 (Screw) ×1 IMPLANT
SCREW LOCK CORT ST 3.5X28 (Screw) ×4 IMPLANT
SCREW LOCK CORT ST 3.5X34 (Screw) ×1 IMPLANT
SCREW LOCK CORT ST 3.5X36 (Screw) ×1 IMPLANT
SCREW LOCK CORT ST 3.5X38 (Screw) ×1 IMPLANT
SCREW PELVIC CORT ST 3.5X50 (Screw) ×1 IMPLANT
SCREW PELVIC CORT ST 3.5X60 (Screw) ×1 IMPLANT
SPONGE T-LAP 18X18 ~~LOC~~+RFID (SPONGE) IMPLANT
STAPLER VISISTAT 35W (STAPLE) ×2 IMPLANT
SUCTION FRAZIER HANDLE 10FR (MISCELLANEOUS) ×2
SUCTION TUBE FRAZIER 10FR DISP (MISCELLANEOUS) ×1 IMPLANT
SUT MNCRL AB 3-0 PS2 18 (SUTURE) ×2 IMPLANT
SUT MON AB 2-0 CT1 36 (SUTURE) ×2 IMPLANT
SUT VIC AB 0 CT1 27 (SUTURE) ×6
SUT VIC AB 0 CT1 27XBRD ANBCTR (SUTURE) ×2 IMPLANT
SUT VIC AB 1 CT1 18XCR BRD 8 (SUTURE) IMPLANT
SUT VIC AB 1 CT1 8-18 (SUTURE)
SUT VIC AB 2-0 CT1 27 (SUTURE) ×4
SUT VIC AB 2-0 CT1 TAPERPNT 27 (SUTURE) ×2 IMPLANT
TOWEL GREEN STERILE (TOWEL DISPOSABLE) ×4 IMPLANT
TOWEL GREEN STERILE FF (TOWEL DISPOSABLE) ×2 IMPLANT
TRAY FOLEY MTR SLVR 16FR STAT (SET/KITS/TRAYS/PACK) IMPLANT
WASHER FOR 5.0 SCREWS (Washer) ×1 IMPLANT
WATER STERILE IRR 1000ML POUR (IV SOLUTION) ×8 IMPLANT
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
## 2022-01-16 NOTE — Transfer of Care (Signed)
Immediate Anesthesia Transfer of Care Note  Patient: Norman Crawford  Procedure(s) Performed: OPEN REDUCTION INTERNAL FIXATION (ORIF) PELVIC FRACTURE (Bilateral)  Patient Location: PACU  Anesthesia Type:General  Level of Consciousness: awake, alert  and oriented  Airway & Oxygen Therapy: Patient Spontanous Breathing and Patient connected to face mask oxygen  Post-op Assessment: Report given to RN and Post -op Vital signs reviewed and stable  Post vital signs: Reviewed and stable  Last Vitals:  Vitals Value Taken Time  BP 99/66 01/16/22 1438  Temp 37.2 C 01/16/22 1438  Pulse 79 01/16/22 1442  Resp 15 01/16/22 1442  SpO2 93 % 01/16/22 1442  Vitals shown include unvalidated device data.  Last Pain:  Vitals:   01/16/22 1143  TempSrc:   PainSc: 8       Patients Stated Pain Goal: 3 (01/16/22 1143)  Complications: No notable events documented.

## 2022-01-16 NOTE — H&P (View-Only) (Signed)
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 iterative reconstruction technique. COMPARISON:  None Available. FINDINGS: Brain: No evidence of acute infarction, hemorrhage, hydrocephalus, extra-axial collection or mass lesion/mass effect. Vascular: No hyperdense vessel or unexpected calcification. Skull: Normal. Negative for fracture or focal lesion. Sinuses/Orbits: Paranasal sinuses and mastoid air cells are clear. Other: None. IMPRESSION: No acute  intracranial abnormalities. Critical Value/emergent results were called by telephone at the time of interpretation on 01/14/2022 at 6:09 pm to provider Kinsinger, who verbally acknowledged these results. Electronically Signed   By: Burman Nieves M.D.   On: 01/14/2022 18:14   DG Wrist Complete Left  Result Date: 01/14/2022 CLINICAL DATA:  Trauma EXAM: LEFT WRIST - COMPLETE 3+ VIEW COMPARISON:  None Available. FINDINGS: Limited single view of left wrist and left hand are submitted for review. Severely comminuted fracture is seen in  the distal shaft and distal end of left radius. There is possible extension of fracture lines to the articular surface. There is fracture in the ulnar styloid. There is widening of space between distal radius and ulna suggesting diastasis of inferior radioulnar joint. Bony spurs are seen in first carpometacarpal joint. IMPRESSION: Limited single portable view. Severely comminuted, displaced and angulated fracture is seen in the distal shaft and distal end of left radius. There is possible extension of fracture to the articular surface in the distal radius. Fracture is seen in the ulnar styloid. There is diastasis in inferior radioulnar joint. Electronically Signed   By: Ernie Avena M.D.   On: 01/14/2022 18:01   DG Pelvis Portable  Result Date: 01/14/2022 CLINICAL DATA:  Trauma EXAM: PORTABLE PELVIS 1-2 VIEWS COMPARISON:  None Available. FINDINGS: Technically limited study. Left iliac bone and left proximal femur are not included in their entirety. There are artifacts outside the patient's body which are obscuring the neck of the right femur. There is abnormal widening of space between the pubic bones measuring 4.2 cm. There is possible widening of left SI joint space. IMPRESSION: Technically limited study. There is marked diastasis in the pubic symphysis. There is possible widening of left SI joint space. Follow-up CT as clinically warranted should be considered. Electronically Signed   By: Ernie Avena M.D.   On: 01/14/2022 17:59   DG Chest Port 1 View  Result Date: 01/14/2022 CLINICAL DATA:  Trauma EXAM: PORTABLE CHEST 1 VIEW COMPARISON:  None Available. FINDINGS: Cardiac size is within normal limits. There is poor inspiration. Lung fields are clear of any infiltrate or pulmonary edema. There is no significant pleural effusion or pneumothorax. There is deformity in the posterior right fourth rib. IMPRESSION: There are no signs of pulmonary edema or focal pulmonary consolidation. There is no  significant pleural effusion or pneumothorax. Deformity without radiolucent line seen in the posterior right fourth rib may suggest old fracture. Electronically Signed   By: Ernie Avena M.D.   On: 01/14/2022 17:57    Review of Systems  HENT:  Negative for ear discharge, ear pain, hearing loss and tinnitus.   Eyes:  Negative for photophobia and pain.  Respiratory:  Negative for cough and shortness of breath.   Cardiovascular:  Negative for chest pain.  Gastrointestinal:  Negative for abdominal pain, nausea and vomiting.  Genitourinary:  Negative for dysuria, flank pain, frequency and urgency.  Musculoskeletal:  Positive for arthralgias (Left arm, pelvis). Negative for back pain, myalgias and neck pain.  Neurological:  Negative for dizziness and headaches.  Hematological:  Does not bruise/bleed easily.  Psychiatric/Behavioral:  The patient is not nervous/anxious.    Blood pressure (!) 158/94, pulse (!) 110, temperature 98.9 F (37.2 C), temperature source Oral, resp. rate 20, height 5\' 9"  (1.753 m), weight 115.7 kg, SpO2 94 %. Physical Exam Constitutional:      General: He is not in acute distress.    Appearance: He is well-developed. He is not diaphoretic.  HENT:     Head: Normocephalic and atraumatic.  Eyes:     General: No scleral icterus.       Right eye: No discharge.        Left eye: No discharge.     Conjunctiva/sclera: Conjunctivae normal.  Cardiovascular:     Rate and Rhythm: Normal rate and regular rhythm.  Pulmonary:     Effort: Pulmonary effort is normal. No respiratory distress.  Musculoskeletal:     Cervical back: Normal range of motion.     Comments: Left shoulder, elbow, wrist, digits- no skin wounds, sugar tong splint in place, no instability, no blocks to motion  Sens  Ax/R/M/U intact  Mot   Ax/ R/ PIN/ M/ AIN/ U intact  Rad 2+  BLE No traumatic wounds, ecchymosis, or rash  Nontender  No knee or ankle effusion  Knee stable to varus/ valgus and  anterior/posterior stress  Sens DPN, SPN, TN intact  Motor EHL, ext, flex, evers 5/5  DP 2+, PT 1+, No significant edema  Skin:    General: Skin is warm and dry.  Neurological:     Mental Status: He is alert.  Psychiatric:        Mood and Affect: Mood normal.        Behavior: Behavior normal.     Assessment/Plan: Left distal radius fx -- Plan ORIF Wednesday with Dr. Doreatha Martin. Open book pelvic fxs -- Plan ORIF with SI screw fixation today with Dr. Doreatha Martin. Please keep NPO.    Lisette Abu, PA-C Orthopedic Surgery [phone] 01/16/2022, 9:04 AM

## 2022-01-16 NOTE — Consult Note (Signed)
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 use of iterative reconstruction technique. COMPARISON:  None Available. FINDINGS: Brain: No evidence of acute infarction, hemorrhage, hydrocephalus, extra-axial collection or mass lesion/mass effect. Vascular: No hyperdense vessel or unexpected calcification. Skull: Normal. Negative for fracture or focal lesion. Sinuses/Orbits: Paranasal sinuses and mastoid air cells are clear. Other: None. IMPRESSION: No acute  intracranial abnormalities. Critical Value/emergent results were called by telephone at the time of interpretation on 01/14/2022 at 6:09 pm to provider Kinsinger, who verbally acknowledged these results. Electronically Signed   By: Burman Nieves M.D.   On: 01/14/2022 18:14   DG Wrist Complete Left  Result Date: 01/14/2022 CLINICAL DATA:  Trauma EXAM: LEFT WRIST - COMPLETE 3+ VIEW COMPARISON:  None Available. FINDINGS: Limited single view of left wrist and left hand are submitted for review. Severely comminuted fracture is seen in  the distal shaft and distal end of left radius. There is possible extension of fracture lines to the articular surface. There is fracture in the ulnar styloid. There is widening of space between distal radius and ulna suggesting diastasis of inferior radioulnar joint. Bony spurs are seen in first carpometacarpal joint. IMPRESSION: Limited single portable view. Severely comminuted, displaced and angulated fracture is seen in the distal shaft and distal end of left radius. There is possible extension of fracture to the articular surface in the distal radius. Fracture is seen in the ulnar styloid. There is diastasis in inferior radioulnar joint. Electronically Signed   By: Ernie Avena M.D.   On: 01/14/2022 18:01   DG Pelvis Portable  Result Date: 01/14/2022 CLINICAL DATA:  Trauma EXAM: PORTABLE PELVIS 1-2 VIEWS COMPARISON:  None Available. FINDINGS: Technically limited study. Left iliac bone and left proximal femur are not included in their entirety. There are artifacts outside the patient's body which are obscuring the neck of the right femur. There is abnormal widening of space between the pubic bones measuring 4.2 cm. There is possible widening of left SI joint space. IMPRESSION: Technically limited study. There is marked diastasis in the pubic symphysis. There is possible widening of left SI joint space. Follow-up CT as clinically warranted should be considered. Electronically Signed   By: Ernie Avena M.D.   On: 01/14/2022 17:59   DG Chest Port 1 View  Result Date: 01/14/2022 CLINICAL DATA:  Trauma EXAM: PORTABLE CHEST 1 VIEW COMPARISON:  None Available. FINDINGS: Cardiac size is within normal limits. There is poor inspiration. Lung fields are clear of any infiltrate or pulmonary edema. There is no significant pleural effusion or pneumothorax. There is deformity in the posterior right fourth rib. IMPRESSION: There are no signs of pulmonary edema or focal pulmonary consolidation. There is no  significant pleural effusion or pneumothorax. Deformity without radiolucent line seen in the posterior right fourth rib may suggest old fracture. Electronically Signed   By: Ernie Avena M.D.   On: 01/14/2022 17:57    Review of Systems  HENT:  Negative for ear discharge, ear pain, hearing loss and tinnitus.   Eyes:  Negative for photophobia and pain.  Respiratory:  Negative for cough and shortness of breath.   Cardiovascular:  Negative for chest pain.  Gastrointestinal:  Negative for abdominal pain, nausea and vomiting.  Genitourinary:  Negative for dysuria, flank pain, frequency and urgency.  Musculoskeletal:  Positive for arthralgias (Left arm, pelvis). Negative for back pain, myalgias and neck pain.  Neurological:  Negative for dizziness and headaches.  Hematological:  Does not bruise/bleed easily.  Psychiatric/Behavioral:  The patient is not nervous/anxious.    Blood pressure (!) 158/94, pulse (!) 110, temperature 98.9 F (37.2 C), temperature source Oral, resp. rate 20, height 5\' 9"  (1.753 m), weight 115.7 kg, SpO2 94 %. Physical Exam Constitutional:      General: He is not in acute distress.    Appearance: He is well-developed. He is not diaphoretic.  HENT:     Head: Normocephalic and atraumatic.  Eyes:     General: No scleral icterus.       Right eye: No discharge.        Left eye: No discharge.     Conjunctiva/sclera: Conjunctivae normal.  Cardiovascular:     Rate and Rhythm: Normal rate and regular rhythm.  Pulmonary:     Effort: Pulmonary effort is normal. No respiratory distress.  Musculoskeletal:     Cervical back: Normal range of motion.     Comments: Left shoulder, elbow, wrist, digits- no skin wounds, sugar tong splint in place, no instability, no blocks to motion  Sens  Ax/R/M/U intact  Mot   Ax/ R/ PIN/ M/ AIN/ U intact  Rad 2+  BLE No traumatic wounds, ecchymosis, or rash  Nontender  No knee or ankle effusion  Knee stable to varus/ valgus and  anterior/posterior stress  Sens DPN, SPN, TN intact  Motor EHL, ext, flex, evers 5/5  DP 2+, PT 1+, No significant edema  Skin:    General: Skin is warm and dry.  Neurological:     Mental Status: He is alert.  Psychiatric:        Mood and Affect: Mood normal.        Behavior: Behavior normal.     Assessment/Plan: Left distal radius fx -- Plan ORIF Wednesday with Dr. Doreatha Martin. Open book pelvic fxs -- Plan ORIF with SI screw fixation today with Dr. Doreatha Martin. Please keep NPO.    Lisette Abu, PA-C Orthopedic Surgery [phone] 01/16/2022, 9:04 AM
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
## 2022-01-16 NOTE — Anesthesia Postprocedure Evaluation (Signed)
Anesthesia Post Note  Patient: Norman Crawford  Procedure(s) Performed: OPEN REDUCTION INTERNAL FIXATION (ORIF) PELVIC FRACTURE (Bilateral)     Patient location during evaluation: PACU Anesthesia Type: General Level of consciousness: awake and alert Pain management: pain level controlled Vital Signs Assessment: post-procedure vital signs reviewed and stable Respiratory status: spontaneous breathing, nonlabored ventilation, respiratory function stable and patient connected to nasal cannula oxygen Cardiovascular status: blood pressure returned to baseline and stable Postop Assessment: no apparent nausea or vomiting Anesthetic complications: no   No notable events documented.  Last Vitals:  Vitals:   01/16/22 1500 01/16/22 1515  BP: 121/74 113/69  Pulse: 81 79  Resp: 14 13  Temp:  36.6 C  SpO2: 94% 92%    Last Pain:  Vitals:   01/16/22 1515  TempSrc:   PainSc: 0-No pain                 Delmont Prosch L Kwamaine Cuppett

## 2022-01-17 ENCOUNTER — Inpatient Hospital Stay (HOSPITAL_COMMUNITY): Payer: No Typology Code available for payment source

## 2022-01-17 LAB — BASIC METABOLIC PANEL
Anion gap: 8 (ref 5–15)
BUN: 36 mg/dL — ABNORMAL HIGH (ref 6–20)
CO2: 23 mmol/L (ref 22–32)
Calcium: 8.1 mg/dL — ABNORMAL LOW (ref 8.9–10.3)
Chloride: 103 mmol/L (ref 98–111)
Creatinine, Ser: 1.88 mg/dL — ABNORMAL HIGH (ref 0.61–1.24)
GFR, Estimated: 41 mL/min — ABNORMAL LOW (ref >60)
Glucose, Bld: 118 mg/dL — ABNORMAL HIGH (ref 70–99)
Potassium: 4.2 mmol/L (ref 3.5–5.1)
Sodium: 134 mmol/L — ABNORMAL LOW (ref 135–145)

## 2022-01-17 LAB — CBC
HCT: 27.3 % — ABNORMAL LOW (ref 39.0–52.0)
Hemoglobin: 9.4 g/dL — ABNORMAL LOW (ref 13.0–17.0)
MCH: 32 pg (ref 26.0–34.0)
MCHC: 34.4 g/dL (ref 30.0–36.0)
MCV: 92.9 fL (ref 80.0–100.0)
Platelets: 128 10*3/uL — ABNORMAL LOW (ref 150–400)
RBC: 2.94 MIL/uL — ABNORMAL LOW (ref 4.22–5.81)
RDW: 13.8 % (ref 11.5–15.5)
WBC: 12.2 10*3/uL — ABNORMAL HIGH (ref 4.0–10.5)
nRBC: 0 % (ref 0.0–0.2)

## 2022-01-17 MED ORDER — IOHEXOL 9 MG/ML PO SOLN
ORAL | Status: AC
  Filled 2022-01-17: qty 1000

## 2022-01-17 MED ORDER — CEFAZOLIN SODIUM-DEXTROSE 2-4 GM/100ML-% IV SOLN
2.0000 g | INTRAVENOUS | Status: AC
  Administered 2022-01-18: 2 g via INTRAVENOUS
  Filled 2022-01-17: qty 100

## 2022-01-17 MED ORDER — ALLOPURINOL 300 MG PO TABS
300.0000 mg | ORAL_TABLET | Freq: Every day | ORAL | Status: DC
  Administered 2022-01-17 – 2022-01-25 (×9): 300 mg via ORAL
  Filled 2022-01-17 (×9): qty 1

## 2022-01-17 MED ORDER — OXYCODONE HCL 5 MG PO TABS
5.0000 mg | ORAL_TABLET | ORAL | Status: DC | PRN
  Administered 2022-01-17: 5 mg via ORAL
  Administered 2022-01-17 – 2022-01-20 (×13): 10 mg via ORAL
  Filled 2022-01-17 (×13): qty 2
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
## 2022-01-18 ENCOUNTER — Other Ambulatory Visit: Payer: Self-pay

## 2022-01-18 ENCOUNTER — Inpatient Hospital Stay (HOSPITAL_COMMUNITY): Payer: No Typology Code available for payment source | Admitting: Certified Registered Nurse Anesthetist

## 2022-01-18 ENCOUNTER — Inpatient Hospital Stay (HOSPITAL_COMMUNITY): Payer: No Typology Code available for payment source

## 2022-01-18 ENCOUNTER — Encounter (HOSPITAL_COMMUNITY): Payer: Self-pay

## 2022-01-18 ENCOUNTER — Encounter (HOSPITAL_COMMUNITY): Admission: EM | Disposition: A | Discharge status: Rehabilitation Facility | Payer: Self-pay | Source: Home / Self Care

## 2022-01-18 DIAGNOSIS — S52372A Galeazzi's fracture of left radius, initial encounter for closed fracture: Secondary | ICD-10-CM

## 2022-01-18 DIAGNOSIS — S3983XA Other specified injuries of pelvis, initial encounter: Secondary | ICD-10-CM

## 2022-01-18 DIAGNOSIS — Z472 Encounter for removal of internal fixation device: Secondary | ICD-10-CM | POA: Diagnosis not present

## 2022-01-18 HISTORY — PX: ORIF RADIAL FRACTURE: SHX5113

## 2022-01-18 HISTORY — PX: ORIF PELVIC FRACTURE: SHX2128

## 2022-01-18 LAB — BASIC METABOLIC PANEL
Anion gap: 7 (ref 5–15)
BUN: 43 mg/dL — ABNORMAL HIGH (ref 6–20)
CO2: 26 mmol/L (ref 22–32)
Calcium: 8.2 mg/dL — ABNORMAL LOW (ref 8.9–10.3)
Chloride: 103 mmol/L (ref 98–111)
Creatinine, Ser: 1.83 mg/dL — ABNORMAL HIGH (ref 0.61–1.24)
GFR, Estimated: 43 mL/min — ABNORMAL LOW (ref >60)
Glucose, Bld: 95 mg/dL (ref 70–99)
Potassium: 4.1 mmol/L (ref 3.5–5.1)
Sodium: 136 mmol/L (ref 135–145)

## 2022-01-18 LAB — CBC
HCT: 27.4 % — ABNORMAL LOW (ref 39.0–52.0)
Hemoglobin: 9.3 g/dL — ABNORMAL LOW (ref 13.0–17.0)
MCH: 31.3 pg (ref 26.0–34.0)
MCHC: 33.9 g/dL (ref 30.0–36.0)
MCV: 92.3 fL (ref 80.0–100.0)
Platelets: 160 10*3/uL (ref 150–400)
RBC: 2.97 MIL/uL — ABNORMAL LOW (ref 4.22–5.81)
RDW: 13.9 % (ref 11.5–15.5)
WBC: 9.9 10*3/uL (ref 4.0–10.5)
nRBC: 0 % (ref 0.0–0.2)

## 2022-01-18 LAB — VITAMIN D 25 HYDROXY (VIT D DEFICIENCY, FRACTURES): Vit D, 25-Hydroxy: 22.52 ng/mL — ABNORMAL LOW (ref 30–100)

## 2022-01-18 SURGERY — OPEN REDUCTION INTERNAL FIXATION (ORIF) RADIAL FRACTURE
Anesthesia: Regional | Laterality: Left

## 2022-01-18 MED ORDER — KETOROLAC TROMETHAMINE 30 MG/ML IJ SOLN: 30.0000 mg | Freq: Once | INTRAMUSCULAR | Status: AC

## 2022-01-18 MED ORDER — MUPIROCIN 2 % EX OINT
TOPICAL_OINTMENT | Freq: Two times a day (BID) | CUTANEOUS | Status: DC
  Administered 2022-01-25: 1 via NASAL
  Filled 2022-01-18 (×2): qty 22

## 2022-01-18 MED ORDER — CEFAZOLIN SODIUM-DEXTROSE 2-4 GM/100ML-% IV SOLN
2.0000 g | Freq: Three times a day (TID) | INTRAVENOUS | Status: AC
  Administered 2022-01-18 – 2022-01-19 (×3): 2 g via INTRAVENOUS
  Filled 2022-01-18 (×3): qty 100

## 2022-01-18 MED ORDER — FENTANYL CITRATE (PF) 100 MCG/2ML IJ SOLN
INTRAMUSCULAR | Status: AC
  Administered 2022-01-18: 100 ug
  Filled 2022-01-18: qty 2

## 2022-01-18 MED ORDER — EPHEDRINE SULFATE-NACL 50-0.9 MG/10ML-% IV SOSY
PREFILLED_SYRINGE | INTRAVENOUS | Status: DC | PRN
  Administered 2022-01-18 (×2): 5 mg via INTRAVENOUS

## 2022-01-18 MED ORDER — VANCOMYCIN HCL 1000 MG IV SOLR
INTRAVENOUS | Status: AC
  Filled 2022-01-18: qty 20

## 2022-01-18 MED ORDER — PHENYLEPHRINE 80 MCG/ML (10ML) SYRINGE FOR IV PUSH (FOR BLOOD PRESSURE SUPPORT)
PREFILLED_SYRINGE | INTRAVENOUS | Status: DC | PRN
  Administered 2022-01-18 (×2): 80 ug via INTRAVENOUS
  Administered 2022-01-18: 160 ug via INTRAVENOUS
  Administered 2022-01-18 (×2): 80 ug via INTRAVENOUS
  Administered 2022-01-18: 240 ug via INTRAVENOUS
  Administered 2022-01-18: 80 ug via INTRAVENOUS

## 2022-01-18 MED ORDER — POLYETHYLENE GLYCOL 3350 17 G PO PACK
17.0000 g | PACK | Freq: Every day | ORAL | Status: DC
  Administered 2022-01-19 – 2022-01-24 (×2): 17 g via ORAL
  Filled 2022-01-18 (×4): qty 1

## 2022-01-18 MED ORDER — LIDOCAINE 2% (20 MG/ML) 5 ML SYRINGE
INTRAMUSCULAR | Status: DC | PRN
  Administered 2022-01-18: 100 mg via INTRAVENOUS

## 2022-01-18 MED ORDER — FENTANYL CITRATE (PF) 250 MCG/5ML IJ SOLN
INTRAMUSCULAR | Status: AC
  Filled 2022-01-18: qty 5

## 2022-01-18 MED ORDER — MIDAZOLAM HCL 2 MG/2ML IJ SOLN
INTRAMUSCULAR | Status: AC
  Filled 2022-01-18: qty 2

## 2022-01-18 MED ORDER — PHENYLEPHRINE HCL-NACL 20-0.9 MG/250ML-% IV SOLN
INTRAVENOUS | Status: DC | PRN
  Administered 2022-01-18: 40 ug/min via INTRAVENOUS

## 2022-01-18 MED ORDER — PROPOFOL 10 MG/ML IV BOLUS
INTRAVENOUS | Status: AC
  Filled 2022-01-18: qty 20

## 2022-01-18 MED ORDER — PROMETHAZINE HCL 25 MG/ML IJ SOLN: 6.2500 mg | INTRAMUSCULAR | Status: DC | PRN

## 2022-01-18 MED ORDER — DEXAMETHASONE SODIUM PHOSPHATE 10 MG/ML IJ SOLN
INTRAMUSCULAR | Status: AC
  Filled 2022-01-18: qty 1

## 2022-01-18 MED ORDER — MIDAZOLAM HCL 2 MG/2ML IJ SOLN: 2.0000 mg | Freq: Once | INTRAMUSCULAR | Status: AC

## 2022-01-18 MED ORDER — VANCOMYCIN HCL 1000 MG IV SOLR
INTRAVENOUS | Status: DC | PRN
  Administered 2022-01-18: 1000 mg via TOPICAL

## 2022-01-18 MED ORDER — MELATONIN 5 MG PO TABS
5.0000 mg | ORAL_TABLET | Freq: Every day | ORAL | Status: DC
  Administered 2022-01-18 – 2022-01-24 (×7): 5 mg via ORAL
  Filled 2022-01-18 (×7): qty 1

## 2022-01-18 MED ORDER — ONDANSETRON HCL 4 MG/2ML IJ SOLN
INTRAMUSCULAR | Status: AC
  Filled 2022-01-18: qty 2

## 2022-01-18 MED ORDER — FENTANYL CITRATE (PF) 250 MCG/5ML IJ SOLN
INTRAMUSCULAR | Status: DC | PRN
  Administered 2022-01-18 (×2): 25 ug via INTRAVENOUS
  Administered 2022-01-18: 100 ug via INTRAVENOUS

## 2022-01-18 MED ORDER — LACTATED RINGERS IV SOLN: INTRAVENOUS | Status: DC

## 2022-01-18 MED ORDER — CHLORHEXIDINE GLUCONATE 0.12 % MT SOLN
OROMUCOSAL | Status: AC
  Administered 2022-01-18: 15 mL via OROMUCOSAL
  Filled 2022-01-18: qty 15

## 2022-01-18 MED ORDER — ACETAMINOPHEN 10 MG/ML IV SOLN
1000.0000 mg | Freq: Once | INTRAVENOUS | Status: AC
Start: 2022-01-18 — End: 2022-01-18
  Administered 2022-01-18: 1000 mg via INTRAVENOUS

## 2022-01-18 MED ORDER — PROPOFOL 10 MG/ML IV BOLUS
INTRAVENOUS | Status: DC | PRN
  Administered 2022-01-18: 200 mg via INTRAVENOUS

## 2022-01-18 MED ORDER — LIDOCAINE 2% (20 MG/ML) 5 ML SYRINGE
INTRAMUSCULAR | Status: AC
  Filled 2022-01-18: qty 5

## 2022-01-18 MED ORDER — CHLORHEXIDINE GLUCONATE 0.12 % MT SOLN: 15.0000 mL | Freq: Once | OROMUCOSAL | Status: AC

## 2022-01-18 MED ORDER — DEXAMETHASONE SODIUM PHOSPHATE 10 MG/ML IJ SOLN
INTRAMUSCULAR | Status: DC | PRN
  Administered 2022-01-18: 10 mg via INTRAVENOUS

## 2022-01-18 MED ORDER — HYDROMORPHONE HCL 1 MG/ML IJ SOLN: 0.2500 mg | INTRAMUSCULAR | Status: DC | PRN

## 2022-01-18 MED ORDER — DIPHENHYDRAMINE HCL 50 MG/ML IJ SOLN
INTRAMUSCULAR | Status: AC
  Filled 2022-01-18: qty 1

## 2022-01-18 MED ORDER — MIDAZOLAM HCL 2 MG/2ML IJ SOLN
INTRAMUSCULAR | Status: AC
  Administered 2022-01-18: 2 mg via INTRAVENOUS
  Filled 2022-01-18: qty 2

## 2022-01-18 MED ORDER — 0.9 % SODIUM CHLORIDE (POUR BTL) OPTIME
TOPICAL | Status: DC | PRN
  Administered 2022-01-18: 1000 mL

## 2022-01-18 MED ORDER — ORAL CARE MOUTH RINSE: 15.0000 mL | Freq: Once | OROMUCOSAL | Status: AC

## 2022-01-18 MED ORDER — DIAZEPAM 5 MG PO TABS
5.0000 mg | ORAL_TABLET | Freq: Four times a day (QID) | ORAL | Status: DC | PRN
  Administered 2022-01-18: 5 mg via ORAL
  Filled 2022-01-18: qty 1

## 2022-01-18 MED ORDER — KETOROLAC TROMETHAMINE 30 MG/ML IJ SOLN
INTRAMUSCULAR | Status: AC
  Administered 2022-01-18: 30 mg via INTRAVENOUS
  Filled 2022-01-18: qty 1

## 2022-01-18 MED ORDER — EPHEDRINE 5 MG/ML INJ
INTRAVENOUS | Status: AC
  Filled 2022-01-18: qty 5

## 2022-01-18 SURGICAL SUPPLY — 81 items
ADH SKN CLS APL DERMABOND .7 (GAUZE/BANDAGES/DRESSINGS) ×2
BAG COUNTER SPONGE SURGICOUNT (BAG) ×3 IMPLANT
BAG SPNG CNTER NS LX DISP (BAG) ×2
BIT DRILL 2.2 SS TIBIAL (BIT) ×1 IMPLANT
BIT DRILL CANN 4.5MM (BIT) IMPLANT
BIT DRILL CANN LRG QC 5X300 (BIT) ×1 IMPLANT
BIT DRILL CLAV ALPS 2.7X145 (BIT) ×1 IMPLANT
BLADE CLIPPER SURG (BLADE) ×1 IMPLANT
BNDG CMPR 9X4 STRL LF SNTH (GAUZE/BANDAGES/DRESSINGS)
BNDG ELASTIC 3X5.8 VLCR STR LF (GAUZE/BANDAGES/DRESSINGS) ×2 IMPLANT
BNDG ELASTIC 4X5.8 VLCR STR LF (GAUZE/BANDAGES/DRESSINGS) ×3 IMPLANT
BNDG ESMARK 4X9 LF (GAUZE/BANDAGES/DRESSINGS) ×2 IMPLANT
BNDG GAUZE ELAST 4 BULKY (GAUZE/BANDAGES/DRESSINGS) ×2 IMPLANT
CLSR STERI-STRIP ANTIMIC 1/2X4 (GAUZE/BANDAGES/DRESSINGS) ×1 IMPLANT
COVER SURGICAL LIGHT HANDLE (MISCELLANEOUS) ×4 IMPLANT
CUFF TOURN SGL QUICK 18X4 (TOURNIQUET CUFF) ×1 IMPLANT
CUFF TOURN SGL QUICK 24 (TOURNIQUET CUFF)
CUFF TRNQT CYL 24X4X16.5-23 (TOURNIQUET CUFF) IMPLANT
DERMABOND ADVANCED (GAUZE/BANDAGES/DRESSINGS) ×3
DERMABOND ADVANCED .7 DNX12 (GAUZE/BANDAGES/DRESSINGS) IMPLANT
DRAPE C-ARM 35X43 STRL (DRAPES) ×2 IMPLANT
DRAPE U-SHAPE 47X51 STRL (DRAPES) ×3 IMPLANT
DRESSING MEPILEX FLEX 4X4 (GAUZE/BANDAGES/DRESSINGS) IMPLANT
DRILL BIT CANN 4.5MM (BIT)
DRSG MEPILEX FLEX 4X4 (GAUZE/BANDAGES/DRESSINGS) ×3
DRSG MEPITEL 4X7.2 (GAUZE/BANDAGES/DRESSINGS) ×1 IMPLANT
DRSG PAD ABDOMINAL 8X10 ST (GAUZE/BANDAGES/DRESSINGS) ×1 IMPLANT
GAUZE SPONGE 4X4 12PLY STRL (GAUZE/BANDAGES/DRESSINGS) ×3 IMPLANT
GAUZE XEROFORM 1X8 LF (GAUZE/BANDAGES/DRESSINGS) ×2 IMPLANT
GLOVE BIO SURGEON STRL SZ 6.5 (GLOVE) ×5 IMPLANT
GLOVE BIO SURGEON STRL SZ7.5 (GLOVE) ×6 IMPLANT
GLOVE BIOGEL PI IND STRL 7.0 (GLOVE) ×2 IMPLANT
GLOVE BIOGEL PI IND STRL 7.5 (GLOVE) ×2 IMPLANT
GLOVE BIOGEL PI INDICATOR 7.0 (GLOVE) ×3
GLOVE BIOGEL PI INDICATOR 7.5 (GLOVE) ×6
GOWN STRL REUS W/ TWL LRG LVL3 (GOWN DISPOSABLE) ×2 IMPLANT
GOWN STRL REUS W/TWL LRG LVL3 (GOWN DISPOSABLE) ×9
GUIDEWIRE 2.0MM (WIRE) ×1 IMPLANT
GUIDEWIRE THREADED 2.8MM (WIRE) ×1 IMPLANT
K-WIRE 1.6 (WIRE) ×3
K-WIRE FX5X1.6XNS BN SS (WIRE) ×2
K-WIRE TROCHAR TIP ALPS 1.6 (WIRE) ×6
KIT BASIN OR (CUSTOM PROCEDURE TRAY) ×3 IMPLANT
KIT TURNOVER KIT B (KITS) ×3 IMPLANT
KWIRE FX5X1.6XNS BN SS (WIRE) IMPLANT
KWIRE TROCHAR TIP ALPS 1.6 (WIRE) IMPLANT
MANIFOLD NEPTUNE II (INSTRUMENTS) ×3 IMPLANT
NEEDLE 22X1 1/2 (OR ONLY) (NEEDLE) IMPLANT
NS IRRIG 1000ML POUR BTL (IV SOLUTION) ×3 IMPLANT
PACK ORTHO EXTREMITY (CUSTOM PROCEDURE TRAY) ×3 IMPLANT
PACK UNIVERSAL I (CUSTOM PROCEDURE TRAY) ×1 IMPLANT
PAD ARMBOARD 7.5X6 YLW CONV (MISCELLANEOUS) ×6 IMPLANT
PAD CAST 3X4 CTTN HI CHSV (CAST SUPPLIES) IMPLANT
PAD CAST 4YDX4 CTTN HI CHSV (CAST SUPPLIES) ×2 IMPLANT
PADDING CAST COTTON 3X4 STRL (CAST SUPPLIES) ×3
PADDING CAST COTTON 4X4 STRL (CAST SUPPLIES) ×3
PENCIL BUTTON HOLSTER BLD 10FT (ELECTRODE) ×1 IMPLANT
PLATE DVR LOCK XLG L ST (Plate) ×1 IMPLANT
SCREW CANN 32 THRD/80 7.3 (Screw) ×1 IMPLANT
SCREW CANN 32 THRD/95 7.3 (Screw) ×1 IMPLANT
SCREW CORT LP 3.5X14 (Screw) ×4 IMPLANT
SCREW LOCK 22X2.7X 3 LD TPR (Screw) IMPLANT
SCREW LOCKING 2.7X22MM (Screw) ×3 IMPLANT
SCREW LP NL 2.7X22MM (Screw) ×2 IMPLANT
SCREW NONLOCK 2.7X18MM (Screw) ×2 IMPLANT
SCREW NONLOCK 2.7X20MM (Screw) ×2 IMPLANT
SPIKE FLUID TRANSFER (MISCELLANEOUS) ×2 IMPLANT
SPLINT PLASTER CAST XFAST 5X30 (CAST SUPPLIES) IMPLANT
SPLINT PLASTER XFAST SET 5X30 (CAST SUPPLIES) ×3
STAPLER VISISTAT 35W (STAPLE) ×1 IMPLANT
SUT ETHILON 3 0 PS 1 (SUTURE) ×1 IMPLANT
SUT MNCRL AB 3-0 PS2 18 (SUTURE) ×2 IMPLANT
SUT VIC AB 2-0 CT1 27 (SUTURE) ×6
SUT VIC AB 2-0 CT1 TAPERPNT 27 (SUTURE) IMPLANT
SYR CONTROL 10ML LL (SYRINGE) ×3 IMPLANT
TOWEL GREEN STERILE (TOWEL DISPOSABLE) ×3 IMPLANT
TOWEL GREEN STERILE FF (TOWEL DISPOSABLE) ×4 IMPLANT
TUBE CONNECTING 12X1/4 (SUCTIONS) ×4 IMPLANT
WASHER FOR 5.0 SCREWS (Washer) ×1 IMPLANT
WATER STERILE IRR 1000ML POUR (IV SOLUTION) ×3 IMPLANT
YANKAUER SUCT BULB TIP NO VENT (SUCTIONS) IMPLANT
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
## 2022-01-18 NOTE — Progress Notes (Signed)
Ortho Trauma Note  Patient was under anesthesia I was reviewing his imaging from yesterday which he had a CT scan of his pelvis.  A transsacral/transiliac screw did not have any fixation of the left SI joint.  Due to the unstable nature of his injury  I felt that posterior left sided fixation was required to prevent failure in the future.  As a result I while he was under anesthesia revision fixation was most appropriate.  I went up and I discussed the situation with his wife the imaging with her.  After discussion we decided to proceed with revision fixation of his pelvis with exchange of right-sided SI screw and placement of a left SI screw to provide left SI joint fixation.  Verbal consent was obtained over the phone while in the operating room.  Roby Lofts, MD [facility] [phone] (office) [email or web address]

## 2022-01-18 NOTE — Transfer of Care (Signed)
Immediate Anesthesia Transfer of Care Note  Patient: Norman Crawford  Procedure(s) Performed: OPEN REDUCTION INTERNAL FIXATION (ORIF) RADIAL FRACTURE (Left) REVISION FIXATION OF PELVIS (Bilateral)  Patient Location: PACU  Anesthesia Type:General  Level of Consciousness: awake  Airway & Oxygen Therapy: Patient Spontanous Breathing and Patient connected to face mask oxygen  Post-op Assessment: Report given to RN and Post -op Vital signs reviewed and stable  Post vital signs: Reviewed and stable  Last Vitals:  Vitals Value Taken Time  BP 159/74 01/18/22 1647  Temp    Pulse 88 01/18/22 1650  Resp 17 01/18/22 1650  SpO2 90 % 01/18/22 1650  Vitals shown include unvalidated device data.  Last Pain:  Vitals:   01/18/22 1240  TempSrc:   PainSc: 4       Patients Stated Pain Goal: 1 (01/18/22 1240)  Complications: No notable events documented.

## 2022-01-18 NOTE — Anesthesia Preprocedure Evaluation (Addendum)
Anesthesia Evaluation  Patient identified by MRN, date of birth, ID band Patient awake    Reviewed: Allergy & Precautions, NPO status , Patient's Chart, lab work & pertinent test results, reviewed documented beta blocker date and time   Airway Mallampati: III  TM Distance: >3 FB Neck ROM: Full    Dental no notable dental hx. (+) Teeth Intact, Dental Advisory Given   Pulmonary neg pulmonary ROS,    Pulmonary exam normal breath sounds clear to auscultation       Cardiovascular hypertension, Pt. on medications and Pt. on home beta blockers Normal cardiovascular exam Rhythm:Regular Rate:Normal     Neuro/Psych negative neurological ROS  negative psych ROS   GI/Hepatic negative GI ROS, Neg liver ROS,   Endo/Other  negative endocrine ROS  Renal/GU Renal InsufficiencyRenal diseaseLab Results      Component                Value               Date                      CREATININE               1.83 (H)            01/18/2022               BUN                      43 (H)              01/18/2022                      K                        4.1                 01/18/2022                negative genitourinary   Musculoskeletal negative musculoskeletal ROS (+)   Abdominal   Peds  Hematology Lab Results      Component                Value               Date                      WBC                      9.9                 01/18/2022                HGB                      9.3 (L)             01/18/2022                HCT                      27.4 (L)            01/18/2022                MCV  92.3                01/18/2022                PLT                      160                 01/18/2022                        Anesthesia Other Findings [facility], open book pelvic fx  Reproductive/Obstetrics                            Anesthesia Physical  Anesthesia Plan  ASA: 2  Anesthesia Plan:  General and Regional   Post-op Pain Management: Regional block* and Tylenol PO (pre-op)*   Induction: Intravenous  PONV Risk Score and Plan: 2 and Midazolam, Dexamethasone and Ondansetron  Airway Management Planned: LMA  Additional Equipment: None  Intra-op Plan:   Post-operative Plan:   Informed Consent: I have reviewed the patients History and Physical, chart, labs and discussed the procedure including the risks, benefits and alternatives for the proposed anesthesia with the patient or authorized representative who has indicated his/her understanding and acceptance.     Dental advisory given  Plan Discussed with: CRNA  Anesthesia Plan Comments:        Anesthesia Quick Evaluation

## 2022-01-18 NOTE — Op Note (Signed)
Orthopaedic Surgery Operative Note (CSN: [id number] ) Date of Surgery: 01/18/2022  Admit Date: 01/14/2022   Diagnoses: Pre-Op Diagnoses: Left Galeazzi fracture/dislocation APC pelvic ring injury  Post-Op Diagnosis: Same  Procedures: CPT 25525-Open reduction internal fixation of left radius fracture and percutaneous fixation of left DRUJ CPT 27216-Percutaneous fixation of left SI joint CPT 20680-Exchange of hardware right sacrum  Surgeons : Primary: Lekendrick Alpern, Gillie Manners, MD  Assistant: Youlanda Roys, RNFA  Location: OR 7   Anesthesia:General   Antibiotics: Ancef 2g preopwith 1 gm vancomycin powder placed topically in forearm incision   Tourniquet time:  Total Tourniquet Time Documented: Upper Arm (Left) - 86 minutes Total: Upper Arm (Left) - 86 minutes  Estimated Blood Loss:50 mL  Complications:* No complications entered in OR log *   Specimens:* No specimens in log *   Implants: Implant Name Type Inv. Item Serial No. Manufacturer Lot No. LRB No. Used Action  PLATE DVR LOCK XLG L ST - [id number] Plate PLATE DVR LOCK XLG L ST  [employer](ORTH,TRAU,BIO,SG) [id number] Left 1 Implanted  SCREW LOCKING 2.7X22MM - [id number] Screw SCREW LOCKING 2.7X22MM  [employer](ORTH,TRAU,BIO,SG)  Left 1 Implanted  SCREW CORT LP 3.5X14 - [id number] Screw SCREW CORT LP 3.5X14  [employer](ORTH,TRAU,BIO,SG)  Left 4 Implanted  SCREW NONLOCK 2.7X20MM - [id number] Screw SCREW NONLOCK 2.7X20MM  [employer](ORTH,TRAU,BIO,SG)  Left 2 Implanted  SCREW NONLOCK 2.7X18MM - [id number] Screw SCREW NONLOCK 2.7X18MM  [employer](ORTH,TRAU,BIO,SG)  Left 2 Implanted  SCREW LP NL 2.7X22MM - [id number] Screw SCREW LP NL 2.7X22MM  [employer](ORTH,TRAU,BIO,SG)  Left 2 Implanted  SCREW CANN 32 THRD/95 7.3 - [id number] Screw SCREW CANN 32 THRD/95 7.3  [employer]TH[employer]  Left 1 Implanted  SCREW CANN 32 THRD/80 7.3 - [id number] Screw SCREW CANN 32 THRD/80 7.3  DEPUY ORTH[employer]  Left 1 Implanted  WASHER FOR 5.0 SCREWS -  [id number] Washer WASHER FOR 5.0 SCREWS  [employer] ORTH[employer]  Left 1 Implanted     Indications for Surgery: 56 year old male who sustained a Galeazzi fracture dislocation.  I felt that he was indicated for open reduction internal fixation with percutaneous fixation of his DRUJ as needed.  Risks and benefits were discussed with the patient.  Risks include but not limited to bleeding, infection, malunion, nonunion, hardware failure, hardware irritation, nerve or blood vessel injury, DVT, even the possibility anesthetic complications.  He agreed to proceed with surgery and consent was obtained.  Intraoperatively we also identified that he had a CT scan performed of his pelvis yesterday evening which showed that his transsacral transiliac screw had no fixation of the left SI joint.  So intraoperatively I discussed with his wife about exchange fixation of his right SI screw and placement of a left-sided SI screw to reinforce fixation.  She agreed to proceed with surgery and consent was obtained.  Operative Findings: 1.  Open reduction internal fixation of left radial shaft fracture using [employer] DVR volar distal radius plate 2.  Unstable DRUJ after fixation of the radius requiring percutaneous fixation using a 1.6 mm K wire 3.  Exchange of fully threaded 7.3 millimeter screw at S1 or a partially-threaded 95 mm screw on the right side and percutaneous placement of a partially-threaded 7.3 millimeter screw at the left SI joint  Procedure: The patient was identified in the preoperative holding area. Consent was confirmed with the patient and their family and all questions were answered. The operative extremity was marked after confirmation with the patient. he was then brought back to the operating room  by our anesthesia colleagues.  He was placed under general anesthetic and carefully transferred over to radiolucent flat top table.  Tourniquet was placed to his upper arm and the left upper extremity was  then prepped and draped in usual sterile fashion.  A timeout was performed to verify the patient, the procedure, and the extremity.  Preoperative antibiotics were dosed.  Fluoroscopic imaging showed the unstable nature of his injury.  Tourniquet was inflated to 250 mmHg.  Total tourniquet time as noted above.  A volar Sherilyn Cooter approach was made and carried down through skin and subcutaneous tissue.  Identified the interval between the radial artery and the brachial radialis and dissected this down to the radial shaft proper.  I try to keep the soft tissue is intact over the comminution and bridge the comminution.  I developed the interval through the FCR tendon sheath and was able to visualize the metaphysis after clearing the pronator quadratus.  I then tunneled a plate underneath the soft tissue bridging the comminution and not even exposing the fracture site.  I placed a K wire distally to hold the plate in place and then used a reduction tenaculum to hold the plate to the radial shaft.  Once I confirmed positioning of the plate I placed a nonlocking screw into the distal segment to bring the plate flush to bone.  I then applied traction through the hand and forearm to gain length.  I compared to preoperative imaging of the contralateral limb to make sure that the radius was out to length.  I then drilled and placed nonlocking screws into the radial shaft.  Total of 4 screws were placed.  I had to exchange some of the screws after initial placement because the radius appeared to be shortened so I exchanged them after pulling more length through the distal radius.  I placed a locking screws into the distal metaphysis to complete the construct.  After fixation of the radius I then tested stability of the DRUJ in pronation supination and neutral.  There is significant mobility of the DRUJ and I felt that fixation was needed.  I then supinated the forearm and proceeded to place a 1.6 mm K wire from the ulna head  into the radial styloid and coming out the skin and the pins were bent and cut.  This prevented pronation and supination.  The incision was then irrigated and closed with 2-0 Vicryl and 3-0 Monocryl and Steri-Strips.  Sterile dressing was applied.  A well-padded sugar-tong splint was placed.  I reviewed the CT scan of his pelvis preoperatively and I reviewed it after I finished up with his wrist and again I felt that there was not adequate fixation of the left SI joint.  I felt that he would be at high risk of failure with just anterior fixation on that side.  As result I went and discussed this with his wife in person.  I discussed what had happened I reviewed the imaging with her.  After full discussion she was in agreement with exchange of the right SI screws and placement of left SI screw.  We obtained consent. the pelvis was then prepped and draped in usual sterile fashion.  A timeout was performed to verify the patient, the procedure and the pelvis.  I reopened a percutaneous incision on the right hip.  I threaded a threaded guidewire through the cannulated screw and was able to remove this without difficulty.  I then placed a partially-threaded 95 mm 7.3 mm  cannulated screw to make sure that was in the safe zone within the sacral body.  I then used inlet and outlet views to direct a 2.0 mm guidewire at the appropriate starting point for a SI screw on the left.  It was advanced into the ilium.  I then cut down on this and drilled with a 4.5 mm cannulated drill bit.  I crossed the SI joint into the sacral body.  I then exchanged this out for a threaded 3.2 mm guidewire.  I then measured the length and placed a partially-threaded 7.3 mm 80 mm cannulated screw.  Good fixation was obtained.  I obtained final fluoroscopic imaging which showed adequate safety placement.  The incisions were then irrigated and closed with 3-0 Monocryl and Dermabond.  The patient was then awoke from anesthesia and taken to the PACU  in stable condition.   Post Op Plan/Instructions: Patient will be and supination to the left upper extremity and nonweightbearing through the wrist and weightbearing as tolerated through the elbow.  He will continue to be nonweightbearing bilateral lower extremities.  He will receive postoperative Ancef.  He will receive Lovenox for DVT prophylaxis.  We will have him mobilize with physical and Occupational Therapy.  I was present and performed the entire surgery.  Truitt Merle, MD [facility]
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
## 2022-01-18 NOTE — Progress Notes (Signed)
Inpatient Rehab Admissions Coordinator:   Per therapy recommendations,  patient was screened for CIR candidacy by Megan Salon, MS, CCC-SLP  At this time, Pt. is not medically ready for CIR, with further surgery planned this afternoon. Additionally, Pt. NWB on 3 extremities and it is not clear that he will be able to tolerate the intensity of CIR.  I will not pursue a rehab consult for this Pt. at this time, but CIR admissions team will follow and monitor for medical readiness and place consult order if Pt. appears to be an appropriate candidate. Please contact me with any questions.   Megan Salon, MS, CCC-SLP Rehab Admissions Coordinator  [phone] (celll) [phone] (office)
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
## 2022-01-18 NOTE — Anesthesia Procedure Notes (Signed)
Anesthesia Regional Block: Supraclavicular block   Pre-Anesthetic Checklist: , timeout performed,  Correct Patient, Correct Site, Correct Laterality,  Correct Procedure, Correct Position, site marked,  Risks and benefits discussed,  Surgical consent,  Pre-op evaluation,  At surgeon's request and post-op pain management  Laterality: Upper and Left  Prep: chloraprep       Needles:  Injection technique: Single-shot  Needle Type: Echogenic Needle     Needle Length: 5cm  Needle Gauge: 25     Additional Needles:   Procedures:,,,, ultrasound used (permanent image in chart),,    Narrative:  Start time: 01/18/2022 12:40 PM End time: 01/18/2022 12:47 PM Injection made incrementally with aspirations every 5 mL.  Performed by: Personally  Anesthesiologist: Trevor Iha, MD  Additional Notes: A functioning IV was confirmed and monitors were applied.  Sterile prep and drape, hand hygiene and sterile gloves were used.  Negative aspiration and test dose prior to incremental administration of local anesthetic using the 25 ga needle. 5 ports used.  The patient tolerated the procedure well.

## 2022-01-18 NOTE — Progress Notes (Signed)
Orthopedic Tech Progress Note Patient Details:  Norman Crawford December 16, 1965 [id number]  Patient ID: [name], male   DOB: 1965/11/20, 56 y.o.   MRN: [id number] Floor called and said MD wanted patient to have OHF to help with movement. Michelle Piper 01/18/2022, 8:07 PM

## 2022-01-18 NOTE — Anesthesia Procedure Notes (Signed)
Procedure Name: LMA Insertion Date/Time: 01/18/2022 1:21 PM  Performed by: Lovie Chol, CRNAPre-anesthesia Checklist: Patient identified, Emergency Drugs available, Suction available and Patient being monitored Patient Re-evaluated:Patient Re-evaluated prior to induction Oxygen Delivery Method: Circle System Utilized Preoxygenation: Pre-oxygenation with 100% oxygen Induction Type: IV induction Ventilation: Mask ventilation without difficulty LMA: LMA with gastric port inserted LMA Size: 5.0 Number of attempts: 1 Placement Confirmation: positive ETCO2 and breath sounds checked- equal and bilateral Tube secured with: Tape Dental Injury: Teeth and Oropharynx as per pre-operative assessment
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
## 2022-01-19 ENCOUNTER — Encounter (HOSPITAL_COMMUNITY): Payer: Self-pay | Admitting: Student

## 2022-01-19 LAB — CBC
HCT: 25.4 % — ABNORMAL LOW (ref 39.0–52.0)
Hemoglobin: 8.8 g/dL — ABNORMAL LOW (ref 13.0–17.0)
MCH: 32 pg (ref 26.0–34.0)
MCHC: 34.6 g/dL (ref 30.0–36.0)
MCV: 92.4 fL (ref 80.0–100.0)
Platelets: 174 10*3/uL (ref 150–400)
RBC: 2.75 MIL/uL — ABNORMAL LOW (ref 4.22–5.81)
RDW: 13.8 % (ref 11.5–15.5)
WBC: 8.6 10*3/uL (ref 4.0–10.5)
nRBC: 0 % (ref 0.0–0.2)

## 2022-01-19 LAB — BASIC METABOLIC PANEL
Anion gap: 6 (ref 5–15)
BUN: 44 mg/dL — ABNORMAL HIGH (ref 6–20)
CO2: 25 mmol/L (ref 22–32)
Calcium: 8.2 mg/dL — ABNORMAL LOW (ref 8.9–10.3)
Chloride: 106 mmol/L (ref 98–111)
Creatinine, Ser: 1.6 mg/dL — ABNORMAL HIGH (ref 0.61–1.24)
GFR, Estimated: 50 mL/min — ABNORMAL LOW (ref >60)
Glucose, Bld: 124 mg/dL — ABNORMAL HIGH (ref 70–99)
Potassium: 4.4 mmol/L (ref 3.5–5.1)
Sodium: 137 mmol/L (ref 135–145)
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
## 2022-01-19 NOTE — Anesthesia Postprocedure Evaluation (Signed)
Anesthesia Post Note  Patient: Norman Crawford  Procedure(s) Performed: OPEN REDUCTION INTERNAL FIXATION (ORIF) RADIAL FRACTURE (Left) REVISION FIXATION OF PELVIS (Bilateral)     Patient location during evaluation: PACU Anesthesia Type: Regional and General Level of consciousness: awake and alert Pain management: pain level controlled Vital Signs Assessment: post-procedure vital signs reviewed and stable Respiratory status: spontaneous breathing Cardiovascular status: stable Anesthetic complications: no   No notable events documented.  Last Vitals:  Vitals:   01/19/22 1604 01/19/22 1928  BP: (!) 146/75 (!) 162/72  Pulse: 85 87  Resp: 15 14  Temp: 37.1 C 36.7 C  SpO2: 93% 97%    Last Pain:  Vitals:   01/19/22 1928  TempSrc: Oral  PainSc: 4                  Lewie Loron

## 2022-01-19 NOTE — TOC Progression Note (Addendum)
Transition of Care [facility]) - Progression Note    Patient Details  Name: Norman Crawford MRN: [id number] Date of Birth: 04/16/1966  [facility] CM/SW Contact  Ella Bodo, RN Phone Number: 01/19/2022, 4:12 PM  Clinical Narrative:    Patient s/p Lt distal ulna/radial repair on 01/18/2022.  Met with patient and wife, at bedside to discuss discharge planning.  Discussed current recommendation of possible CIR, pending progress with therapies.  Pt/wife agree that rehab likely needed, and wife can provide 24h care.    Will continue to follow progress.    PCP is Dr. Donald Prose.     Expected Discharge Plan: IP Rehab Facility Barriers to Discharge: Continued Medical Work up  Expected Discharge Plan and Services Expected Discharge Plan: [place]   Discharge Planning Services: CM Consult   Living arrangements for the past 2 months: Single Family Home                                       Social Determinants of Health (SDOH) Interventions    Readmission Risk Interventions     No data to display         Reinaldo Raddle, RN, BSN  Trauma/Neuro ICU Case Manager [phone]
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
## 2022-01-19 NOTE — TOC CAGE-AID Note (Signed)
Transition of Care [facility]) - CAGE-AID Screening   Patient Details  Name: AVI KERSCHNER MRN: [id number] Date of Birth: 05-04-1966  [facility] CM/SW Contact:    Leota Sauers, RN Phone Number: 01/19/2022, 4:55 AM   Clinical Narrative: Patient uses alcohol occasionally, denies illicit drug use.   CAGE-AID Screening:    Have You Ever Felt You Ought to Cut Down on Your Drinking or Drug Use?: No Have People Annoyed You By Critizing Your Drinking Or Drug Use?: No Have You Felt Bad Or Guilty About Your Drinking Or Drug Use?: No Have You Ever Had a Drink or Used Drugs First Thing In The Morning to Steady Your Nerves or to Get Rid of a Hangover?: No CAGE-AID Score: 0  Substance Abuse Education Offered: No
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
## 2022-01-20 LAB — BASIC METABOLIC PANEL
Anion gap: 7 (ref 5–15)
BUN: 44 mg/dL — ABNORMAL HIGH (ref 6–20)
CO2: 26 mmol/L (ref 22–32)
Calcium: 8.3 mg/dL — ABNORMAL LOW (ref 8.9–10.3)
Chloride: 105 mmol/L (ref 98–111)
Creatinine, Ser: 1.52 mg/dL — ABNORMAL HIGH (ref 0.61–1.24)
GFR, Estimated: 53 mL/min — ABNORMAL LOW (ref >60)
Glucose, Bld: 96 mg/dL (ref 70–99)
Potassium: 3.7 mmol/L (ref 3.5–5.1)
Sodium: 138 mmol/L (ref 135–145)

## 2022-01-20 LAB — CBC
HCT: 25.2 % — ABNORMAL LOW (ref 39.0–52.0)
Hemoglobin: 8.3 g/dL — ABNORMAL LOW (ref 13.0–17.0)
MCH: 31.2 pg (ref 26.0–34.0)
MCHC: 32.9 g/dL (ref 30.0–36.0)
MCV: 94.7 fL (ref 80.0–100.0)
Platelets: 175 10*3/uL (ref 150–400)
RBC: 2.66 MIL/uL — ABNORMAL LOW (ref 4.22–5.81)
RDW: 14.1 % (ref 11.5–15.5)
WBC: 8.6 10*3/uL (ref 4.0–10.5)
nRBC: 0.2 % (ref 0.0–0.2)

## 2022-01-20 MED ORDER — HYDROCHLOROTHIAZIDE 25 MG PO TABS
25.0000 mg | ORAL_TABLET | Freq: Every day | ORAL | Status: DC
  Administered 2022-01-20 – 2022-01-25 (×6): 25 mg via ORAL
  Filled 2022-01-20 (×6): qty 1

## 2022-01-20 MED ORDER — LOSARTAN POTASSIUM 50 MG PO TABS
100.0000 mg | ORAL_TABLET | Freq: Every day | ORAL | Status: DC
  Administered 2022-01-20 – 2022-01-25 (×6): 100 mg via ORAL
  Filled 2022-01-20 (×6): qty 2

## 2022-01-20 MED ORDER — LOSARTAN POTASSIUM-HCTZ 100-25 MG PO TABS: 1.0000 | ORAL_TABLET | Freq: Every day | ORAL | Status: DC

## 2022-01-20 MED ORDER — ATENOLOL 25 MG PO TABS
100.0000 mg | ORAL_TABLET | Freq: Every morning | ORAL | Status: DC
  Administered 2022-01-20 – 2022-01-25 (×6): 100 mg via ORAL
  Filled 2022-01-20 (×6): qty 4

## 2022-01-20 MED ORDER — OXYCODONE HCL 5 MG PO TABS
10.0000 mg | ORAL_TABLET | ORAL | Status: DC | PRN
  Administered 2022-01-20: 10 mg via ORAL
  Administered 2022-01-20 – 2022-01-24 (×14): 15 mg via ORAL
  Filled 2022-01-20: qty 3
  Filled 2022-01-20: qty 2
  Filled 2022-01-20: qty 3
  Filled 2022-01-20: qty 2
  Filled 2022-01-20 (×13): qty 3

## 2022-01-20 MED ORDER — BOOST / RESOURCE BREEZE PO LIQD CUSTOM
1.0000 | Freq: Three times a day (TID) | ORAL | Status: DC
  Administered 2022-01-20 – 2022-01-25 (×5): 1 via ORAL
  Filled 2022-01-20 (×2): qty 1
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
## 2022-01-20 NOTE — Progress Notes (Signed)
Inpatient Rehab Admissions Coordinator:   I spoke with Pt.'s wife regarding CIR admit. She is interested and can provide necessary support at d/c. I will follow for potential admit pending insurance auth.  Megan Salon, MS, CCC-SLP Rehab Admissions Coordinator  [phone] (celll) [phone] (office)
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
## 2022-01-20 NOTE — Progress Notes (Signed)
   Trauma/Critical Care Follow Up Note  Subjective:    Overnight Issues:   Objective:  Vital signs for last 24 hours: Temp:  [98 F (36.7 C)-98.7 F (37.1 C)] 98 F (36.7 C) (08/04 0742) Pulse Rate:  [77-87] 86 (08/04 0742) Resp:  [14-18] 16 (08/04 0742) BP: (137-173)/(61-95) 163/95 (08/04 0742) SpO2:  [93 %-99 %] 97 % (08/04 0742)  Hemodynamic parameters for last 24 hours:    Intake/Output from previous day: 08/03 0701 - 08/04 0700 In: 240 [P.O.:240] Out: 775 [Urine:775]  Intake/Output this shift: No intake/output data recorded.  Vent settings for last 24 hours:    Physical Exam:  Gen: comfortable, no distress Neuro: non-focal exam HEENT: PERRL Neck: supple CV: RRR Pulm: unlabored breathing Abd: soft, NT GU: clear yellow urine Extr: wwp, no edema   Results for orders placed or performed during the hospital encounter of 01/14/22 (from the past 24 hour(s))  CBC     Status: Abnormal   Collection Time: 01/20/22  5:00 AM  Result Value Ref Range   WBC 8.6 4.0 - 10.5 K/uL   RBC 2.66 (L) 4.22 - 5.81 MIL/uL   Hemoglobin 8.3 (L) 13.0 - 17.0 g/dL   HCT 05.3 (L) 97.6 - 73.4 %   MCV 94.7 80.0 - 100.0 fL   MCH 31.2 26.0 - 34.0 pg   MCHC 32.9 30.0 - 36.0 g/dL   RDW 19.3 79.0 - 24.0 %   Platelets 175 150 - 400 K/uL   nRBC 0.2 0.0 - 0.2 %  Basic metabolic panel     Status: Abnormal   Collection Time: 01/20/22  5:12 AM  Result Value Ref Range   Sodium 138 135 - 145 mmol/L   Potassium 3.7 3.5 - 5.1 mmol/L   Chloride 105 98 - 111 mmol/L   CO2 26 22 - 32 mmol/L   Glucose, Bld 96 70 - 99 mg/dL   BUN 44 (H) 6 - 20 mg/dL   Creatinine, Ser 9.73 (H) 0.61 - 1.24 mg/dL   Calcium 8.3 (L) 8.9 - 10.3 mg/dL   GFR, Estimated 53 (L) >60 mL/min   Anion gap 7 5 - 15    Assessment & Plan: The plan of care was discussed with the bedside nurse for the day, who is in agreement with this plan and no additional concerns were raised.   Present on Admission:  Pelvic fracture  (HCC)    LOS: 6 days   Additional comments:I reviewed the patient's new clinical lab test results.   and I reviewed the patients new imaging test results.    41M [facility]   Open book pelvic fracture/sacral fracture - ORIF 7/31, Dr. Jena Gauss. NWB BLE, bed to chair transfer x6 weeks L distal ulna/radial fracture - in splint, ortho consult, OR 8/2 Mesenteric hematoma - repeat noncontrast CT 8/1 with no acute issues. Continue to monitor abdominal exam. Poor PO intake, add Boost ABL anemia - Hgb 8.8 HTN CKD FEN- IVF, reg diet VTE- LMWH, SCDs Foley- foley out 8/3, voiding Dispo- 4NP, PT/OT, anticipate CIR    Diamantina Monks, MD Trauma & General Surgery Please use [email or web address] to contact on call provider  01/20/2022  *Care during the described time interval was provided by me. I have reviewed this patient's available data, including medical history, events of note, physical examination and test results as part of my evaluation.

## 2022-01-21 LAB — CBC
HCT: 25.8 % — ABNORMAL LOW (ref 39.0–52.0)
Hemoglobin: 8.6 g/dL — ABNORMAL LOW (ref 13.0–17.0)
MCH: 31.5 pg (ref 26.0–34.0)
MCHC: 33.3 g/dL (ref 30.0–36.0)
MCV: 94.5 fL (ref 80.0–100.0)
Platelets: 201 10*3/uL (ref 150–400)
RBC: 2.73 MIL/uL — ABNORMAL LOW (ref 4.22–5.81)
RDW: 14 % (ref 11.5–15.5)
WBC: 9.8 10*3/uL (ref 4.0–10.5)
nRBC: 0.3 % — ABNORMAL HIGH (ref 0.0–0.2)

## 2022-01-21 LAB — BASIC METABOLIC PANEL
Anion gap: 7 (ref 5–15)
BUN: 36 mg/dL — ABNORMAL HIGH (ref 6–20)
CO2: 25 mmol/L (ref 22–32)
Calcium: 8.4 mg/dL — ABNORMAL LOW (ref 8.9–10.3)
Chloride: 103 mmol/L (ref 98–111)
Creatinine, Ser: 1.32 mg/dL — ABNORMAL HIGH (ref 0.61–1.24)
GFR, Estimated: >60 mL/min (ref >60)
Glucose, Bld: 106 mg/dL — ABNORMAL HIGH (ref 70–99)
Potassium: 3.9 mmol/L (ref 3.5–5.1)
Sodium: 135 mmol/L (ref 135–145)

## 2022-01-21 MED ORDER — CAMPHOR-MENTHOL 0.5-0.5 % EX LOTN
TOPICAL_LOTION | CUTANEOUS | Status: DC | PRN
  Filled 2022-01-21: qty 222

## 2022-01-21 MED ORDER — DIPHENHYDRAMINE HCL 25 MG PO CAPS: 25.0000 mg | ORAL_CAPSULE | Freq: Three times a day (TID) | ORAL | Status: DC | PRN
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
## 2022-01-21 NOTE — Progress Notes (Addendum)
While bathing pt noted red rash on back rash appears to only be on back. Pt reports rash is itchy states his only drug allergy is PCN. MD paged. Awaiting response.   1830- Dr. Dwain Sarna returned paged. See new orders for Sarna and Benadryl PRN.

## 2022-01-22 NOTE — Progress Notes (Signed)
Patient ID: Norman Crawford, male   DOB: 11/17/1965, 56 y.o.   MRN: [id number] 4 Days Post-Op    Subjective: Doing well ROS negative except as listed above. Objective: Vital signs in last 24 hours: Temp:  [97.9 F (36.6 C)-99.2 F (37.3 C)] 98.8 F (37.1 C) (08/06 0724) Pulse Rate:  [60-77] 68 (08/06 0724) Resp:  [13-20] 20 (08/06 0303) BP: (106-156)/(60-79) 106/67 (08/06 0724) SpO2:  [92 %-97 %] 97 % (08/06 0724) Last BM Date : 01/20/22  Intake/Output from previous day: 08/05 0701 - 08/06 0700 In: -  Out: 2300 [Urine:2300] Intake/Output this shift: Total I/O In: -  Out: 1100 [Urine:1100]  General appearance: cooperative Resp: clear to auscultation bilaterally Cardio: regular rate and rhythm GI: soft, NT Extremities: LUE splint, feet warm  Lab Results: CBC  Recent Labs    01/20/22 0500 01/21/22 0418  WBC 8.6 9.8  HGB 8.3* 8.6*  HCT 25.2* 25.8*  PLT 175 201   BMET Recent Labs    01/20/22 0512 01/21/22 0418  NA 138 135  K 3.7 3.9  CL 105 103  CO2 26 25  GLUCOSE 96 106*  BUN 44* 36*  CREATININE 1.52* 1.32*  CALCIUM 8.3* 8.4*   PT/INR No results for input(s): "LABPROT", "INR" in the last 72 hours. ABG No results for input(s): "PHART", "HCO3" in the last 72 hours.  Invalid input(s): "PCO2", "PO2"  Studies/Results: No results found.  Anti-infectives: Anti-infectives (From admission, onward)    Start     Dose/Rate Route Frequency Ordered Stop   01/18/22 2200  ceFAZolin (ANCEF) IVPB 2g/100 mL premix        2 g 200 mL/hr over 30 Minutes Intravenous Every 8 hours 01/18/22 1910 01/19/22 1425   01/18/22 1447  vancomycin (VANCOCIN) powder  Status:  Discontinued          As needed 01/18/22 1448 01/18/22 1639   01/18/22 1300  ceFAZolin (ANCEF) IVPB 2g/100 mL premix        2 g 200 mL/hr over 30 Minutes Intravenous On call to O.R. 01/17/22 1110 01/18/22 1343   01/16/22 2000  ceFAZolin (ANCEF) IVPB 2g/100 mL premix        2 g 200 mL/hr over 30 Minutes  Intravenous Every 8 hours 01/16/22 1550 01/17/22 1430   01/16/22 1408  vancomycin (VANCOCIN) powder  Status:  Discontinued          As needed 01/16/22 1408 01/16/22 1434   01/16/22 1100  ceFAZolin (ANCEF) IVPB 2g/100 mL premix        2 g 200 mL/hr over 30 Minutes Intravenous On call to O.R. 01/16/22 1055 01/16/22 1233   01/16/22 1059  ceFAZolin (ANCEF) 2-4 GM/100ML-% IVPB       Note to Pharmacy: Launa Flight M: cabinet override      01/16/22 1059 01/16/22 1248   01/14/22 1800  clindamycin (CLEOCIN) IVPB 600 mg        600 mg 100 mL/hr over 30 Minutes Intravenous  Once 01/14/22 1746 01/14/22 1949       Assessment/Plan: 61M [facility]   Open book pelvic fracture/sacral fracture - ORIF 7/31, Dr. Jena Gauss. NWB BLE, bed to chair transfer x6 weeks L distal ulna/radial fracture - in splint, ortho consult, OR 8/2 Mesenteric hematoma - repeat noncontrast CT 8/1 with no acute issues. Continue to monitor abdominal exam. Boost ABL anemia  HTN CKD FEN- IVF, reg diet VTE- LMWH, SCDs Foley- foley out 8/3, voiding Dispo- 4NP, PT/OT, anticipate CIR   LOS: 8 days  Violeta Gelinas, MD, MPH, FACS Trauma & General Surgery Use [email or web address] to contact on call provider  01/22/2022
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
## 2022-01-23 NOTE — Progress Notes (Signed)
Inpatient Rehab Admissions Coordinator:   Continue to await insurance auth for  CIR. Will follow for potential admit pending auth.   Megan Salon, MS, CCC-SLP Rehab Admissions Coordinator  [phone] (celll) [phone] (office)
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
## 2022-01-23 NOTE — Progress Notes (Signed)
Orthopaedic Trauma Progress Note  SUBJECTIVE: Doing well. Pain controlled. Making good improvements with therapies. No no complaints. Wife at bedside. Awaiting insurance auth for CIR  OBJECTIVE:  Vitals:   01/23/22 0352 01/23/22 0700  BP: 97/65 103/65  Pulse: 73   Resp: 15   Temp: 98.6 F (37 C) 98.5 F (36.9 C)  SpO2: 98%     General: Sitting up in bed, NAD Respiratory: No increased work of breathing.  QIO:NGEXBM in place. Non-tender above splint. Able to wiggle fingers. Swelling throughout hand improving. Fingers warm and well perfused Pelvis/BLE: Incisions CDI. Endorses sensation throughout extremities. Able to flex hips/knees without significant pain. Ankle DF/PF intact. +DP pulse bilaterally  IMAGING: Stable post op imaging.   LABS: No results found for this or any previous visit (from the past 24 hour(s)).  ASSESSMENT: Norman Crawford is a 56 y.o. male, 5 Days Post-Op s/p  ORIF LEFT RADIUS FRACTURE WITH PERCUTANEOUS FIXATION DRUJ ORIF PUBIC SYMPHYSIS PERCUTANEOUS FIXATION RIGHT SACRUM AND LEFT SI JOINT  CV/Blood loss: Hemodynamically stable  PLAN: Weightbearing:  - BLE: NWB.  Bed to chair transfer x 6 weeks - LUE: Okay to weight-bear through elbow, NWB through wrist ROM:  - BLE: Okay for unrestricted ROM - LUE: Okay for shoulder and elbow motion.  Maintain splint Incisional and dressing care:  - Pelvis/BLE: Incisions may be left open to air - LUE: Maintain splint Showering: Okay to shower, keep splint LUE dry Orthopedic device(s): Splint LUE  Pain management: Continue current regimen VTE prophylaxis: Lovenox, SCDs ID:  Ancef 2gm post op completed Foley/Lines:  No foley, KVO IVFs Impediments to Fracture Healing: Vitamin D level 22.52, started on supplementation Dispo: PT/OT as tolerated.  Patient okay for discharge to CIR from Ortho standpoint. Will order repeat x-rays pelvis and left wrist early next week  D/C recommendations: -Switch to Eliquis 2.5 mg twice  daily x 14 days for DVT prophylaxis -Continue Vit D supplementation x 90 days  Follow - up plan: Will continue to follow on hospital plan for outpatient follow-up with Dr. Jena Gauss 2 weeks after discharge   Contact information:  Truitt Merle MD, Thyra Breed PA-C. After hours and holidays please check [email or web address] for group call information for [facility]   Thompson Caul, PA-C [phone] (office) [email or web address]
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
## 2022-01-24 NOTE — Progress Notes (Signed)
Inpatient Rehab Admissions Coordinator:    I continue to await insurance auth and will follow for admit pending auth.   Megan Salon, MS, CCC-SLP Rehab Admissions Coordinator  [phone] (celll) [phone] (office)
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
## 2022-01-25 ENCOUNTER — Inpatient Hospital Stay (HOSPITAL_COMMUNITY)
Admission: RE | Admit: 2022-01-25 | Discharge: 2022-02-08 | DRG: 560 | Disposition: A | Discharge status: Home / Self Care | Payer: No Typology Code available for payment source | Source: Intra-hospital | Attending: Physical Medicine and Rehabilitation | Admitting: Physical Medicine and Rehabilitation

## 2022-01-25 DIAGNOSIS — S3289XA Fracture of other parts of pelvis, initial encounter for closed fracture: Secondary | ICD-10-CM | POA: Diagnosis not present

## 2022-01-25 DIAGNOSIS — K5901 Slow transit constipation: Secondary | ICD-10-CM | POA: Diagnosis not present

## 2022-01-25 DIAGNOSIS — G8918 Other acute postprocedural pain: Secondary | ICD-10-CM | POA: Diagnosis not present

## 2022-01-25 DIAGNOSIS — I10 Essential (primary) hypertension: Secondary | ICD-10-CM | POA: Diagnosis present

## 2022-01-25 DIAGNOSIS — S52372D Galeazzi's fracture of left radius, subsequent encounter for closed fracture with routine healing: Secondary | ICD-10-CM | POA: Diagnosis not present

## 2022-01-25 DIAGNOSIS — Y92410 Unspecified street and highway as the place of occurrence of the external cause: Secondary | ICD-10-CM

## 2022-01-25 DIAGNOSIS — S32810D Multiple fractures of pelvis with stable disruption of pelvic ring, subsequent encounter for fracture with routine healing: Secondary | ICD-10-CM | POA: Diagnosis present

## 2022-01-25 DIAGNOSIS — T07XXXA Unspecified multiple injuries, initial encounter: Secondary | ICD-10-CM | POA: Diagnosis present

## 2022-01-25 DIAGNOSIS — R000 Tachycardia, unspecified: Secondary | ICD-10-CM | POA: Diagnosis present

## 2022-01-25 DIAGNOSIS — M109 Gout, unspecified: Secondary | ICD-10-CM | POA: Diagnosis present

## 2022-01-25 DIAGNOSIS — Z741 Need for assistance with personal care: Secondary | ICD-10-CM | POA: Diagnosis present

## 2022-01-25 DIAGNOSIS — Z880 Allergy status to penicillin: Secondary | ICD-10-CM

## 2022-01-25 DIAGNOSIS — S62102D Fracture of unspecified carpal bone, left wrist, subsequent encounter for fracture with routine healing: Secondary | ICD-10-CM

## 2022-01-25 DIAGNOSIS — E559 Vitamin D deficiency, unspecified: Secondary | ICD-10-CM | POA: Diagnosis present

## 2022-01-25 DIAGNOSIS — Z79899 Other long term (current) drug therapy: Secondary | ICD-10-CM | POA: Diagnosis not present

## 2022-01-25 DIAGNOSIS — I959 Hypotension, unspecified: Secondary | ICD-10-CM | POA: Diagnosis not present

## 2022-01-25 DIAGNOSIS — R339 Retention of urine, unspecified: Secondary | ICD-10-CM | POA: Diagnosis present

## 2022-01-25 DIAGNOSIS — Z885 Allergy status to narcotic agent status: Secondary | ICD-10-CM

## 2022-01-25 DIAGNOSIS — S32129D Unspecified Zone II fracture of sacrum, subsequent encounter for fracture with routine healing: Secondary | ICD-10-CM | POA: Diagnosis not present

## 2022-01-25 DIAGNOSIS — S32409S Unspecified fracture of unspecified acetabulum, sequela: Secondary | ICD-10-CM | POA: Diagnosis not present

## 2022-01-25 DIAGNOSIS — N179 Acute kidney failure, unspecified: Secondary | ICD-10-CM | POA: Diagnosis present

## 2022-01-25 DIAGNOSIS — D72829 Elevated white blood cell count, unspecified: Secondary | ICD-10-CM | POA: Diagnosis present

## 2022-01-25 DIAGNOSIS — D62 Acute posthemorrhagic anemia: Secondary | ICD-10-CM | POA: Diagnosis present

## 2022-01-25 DIAGNOSIS — E785 Hyperlipidemia, unspecified: Secondary | ICD-10-CM | POA: Diagnosis present

## 2022-01-25 DIAGNOSIS — K5900 Constipation, unspecified: Secondary | ICD-10-CM | POA: Diagnosis not present

## 2022-01-25 DIAGNOSIS — F32A Depression, unspecified: Secondary | ICD-10-CM | POA: Diagnosis present

## 2022-01-25 DIAGNOSIS — S62102A Fracture of unspecified carpal bone, left wrist, initial encounter for closed fracture: Secondary | ICD-10-CM | POA: Diagnosis not present

## 2022-01-25 DIAGNOSIS — E876 Hypokalemia: Secondary | ICD-10-CM | POA: Diagnosis not present

## 2022-01-25 DIAGNOSIS — S329XXA Fracture of unspecified parts of lumbosacral spine and pelvis, initial encounter for closed fracture: Secondary | ICD-10-CM | POA: Diagnosis present

## 2022-01-25 MED ORDER — OXYCODONE HCL 5 MG PO TABS
10.0000 mg | ORAL_TABLET | ORAL | Status: DC | PRN
  Administered 2022-01-31: 15 mg via ORAL
  Administered 2022-02-01: 10 mg via ORAL
  Administered 2022-02-01 – 2022-02-05 (×3): 15 mg via ORAL
  Filled 2022-01-25 (×4): qty 3
  Filled 2022-01-25: qty 2
  Filled 2022-01-25: qty 3

## 2022-01-25 MED ORDER — GUAIFENESIN-DM 100-10 MG/5ML PO SYRP: 5.0000 mL | ORAL_SOLUTION | Freq: Four times a day (QID) | ORAL | Status: DC | PRN

## 2022-01-25 MED ORDER — VITAMIN D (ERGOCALCIFEROL) 1.25 MG (50000 UNIT) PO CAPS
50000.0000 [IU] | ORAL_CAPSULE | ORAL | Status: DC
  Administered 2022-01-25 – 2022-02-01 (×2): 50000 [IU] via ORAL
  Filled 2022-01-25 (×2): qty 1

## 2022-01-25 MED ORDER — TRAZODONE HCL 50 MG PO TABS
25.0000 mg | ORAL_TABLET | Freq: Every evening | ORAL | Status: DC | PRN
  Administered 2022-02-06 – 2022-02-07 (×2): 50 mg via ORAL
  Filled 2022-01-25 (×2): qty 1

## 2022-01-25 MED ORDER — ATORVASTATIN CALCIUM 10 MG PO TABS
20.0000 mg | ORAL_TABLET | Freq: Every day | ORAL | Status: DC
  Administered 2022-01-25 – 2022-02-08 (×15): 20 mg via ORAL
  Filled 2022-01-25 (×15): qty 2

## 2022-01-25 MED ORDER — METHOCARBAMOL 500 MG PO TABS
1000.0000 mg | ORAL_TABLET | Freq: Three times a day (TID) | ORAL | Status: DC
  Administered 2022-01-25 – 2022-02-05 (×30): 1000 mg via ORAL
  Filled 2022-01-25 (×33): qty 2

## 2022-01-25 MED ORDER — ACETAMINOPHEN 325 MG PO TABS
325.0000 mg | ORAL_TABLET | ORAL | Status: DC | PRN
  Administered 2022-01-30 – 2022-02-07 (×6): 650 mg via ORAL
  Filled 2022-01-25 (×5): qty 2

## 2022-01-25 MED ORDER — LIDOCAINE HCL URETHRAL/MUCOSAL 2 % EX GEL
1.0000 | CUTANEOUS | Status: DC | PRN
  Filled 2022-01-25: qty 6

## 2022-01-25 MED ORDER — HYDROCHLOROTHIAZIDE 25 MG PO TABS
25.0000 mg | ORAL_TABLET | Freq: Every day | ORAL | Status: DC
  Administered 2022-01-26 – 2022-01-28 (×3): 25 mg via ORAL
  Filled 2022-01-25 (×3): qty 1

## 2022-01-25 MED ORDER — MUPIROCIN 2 % EX OINT
TOPICAL_OINTMENT | Freq: Two times a day (BID) | CUTANEOUS | Status: DC
  Administered 2022-01-27: 1 via NASAL
  Filled 2022-01-25: qty 22

## 2022-01-25 MED ORDER — ATENOLOL 25 MG PO TABS
100.0000 mg | ORAL_TABLET | Freq: Every morning | ORAL | Status: DC
  Administered 2022-01-26 – 2022-01-27 (×2): 100 mg via ORAL
  Filled 2022-01-25 (×2): qty 4

## 2022-01-25 MED ORDER — ENOXAPARIN SODIUM 30 MG/0.3ML IJ SOSY: 30.0000 mg | PREFILLED_SYRINGE | Freq: Two times a day (BID) | INTRAMUSCULAR | Status: DC

## 2022-01-25 MED ORDER — ALLOPURINOL 300 MG PO TABS
300.0000 mg | ORAL_TABLET | Freq: Every day | ORAL | Status: DC
  Administered 2022-01-26 – 2022-02-08 (×14): 300 mg via ORAL
  Filled 2022-01-25 (×14): qty 1

## 2022-01-25 MED ORDER — MAGNESIUM HYDROXIDE 400 MG/5ML PO SUSP
30.0000 mL | Freq: Every day | ORAL | Status: DC | PRN
  Administered 2022-02-03: 30 mL via ORAL
  Filled 2022-01-25: qty 30

## 2022-01-25 MED ORDER — BISACODYL 5 MG PO TBEC
5.0000 mg | DELAYED_RELEASE_TABLET | Freq: Every day | ORAL | Status: DC | PRN
  Administered 2022-02-04: 5 mg via ORAL
  Filled 2022-01-25: qty 1

## 2022-01-25 MED ORDER — DOCUSATE SODIUM 100 MG PO CAPS
100.0000 mg | ORAL_CAPSULE | Freq: Two times a day (BID) | ORAL | Status: DC
  Administered 2022-01-25 – 2022-02-08 (×20): 100 mg via ORAL
  Filled 2022-01-25 (×28): qty 1

## 2022-01-25 MED ORDER — PROCHLORPERAZINE 25 MG RE SUPP: 12.5000 mg | Freq: Four times a day (QID) | RECTAL | Status: DC | PRN

## 2022-01-25 MED ORDER — MELATONIN 5 MG PO TABS
5.0000 mg | ORAL_TABLET | Freq: Every day | ORAL | Status: DC
  Administered 2022-01-25 – 2022-02-07 (×14): 5 mg via ORAL
  Filled 2022-01-25 (×14): qty 1

## 2022-01-25 MED ORDER — FLEET ENEMA 7-19 GM/118ML RE ENEM: 1.0000 | ENEMA | Freq: Once | RECTAL | Status: DC | PRN

## 2022-01-25 MED ORDER — APIXABAN 2.5 MG PO TABS
2.5000 mg | ORAL_TABLET | Freq: Two times a day (BID) | ORAL | Status: AC
  Administered 2022-01-25 – 2022-02-08 (×28): 2.5 mg via ORAL
  Filled 2022-01-25 (×28): qty 1

## 2022-01-25 MED ORDER — PROCHLORPERAZINE EDISYLATE 10 MG/2ML IJ SOLN: 5.0000 mg | Freq: Four times a day (QID) | INTRAMUSCULAR | Status: DC | PRN

## 2022-01-25 MED ORDER — LOSARTAN POTASSIUM 50 MG PO TABS
100.0000 mg | ORAL_TABLET | Freq: Every day | ORAL | Status: DC
  Administered 2022-01-26 – 2022-01-28 (×3): 100 mg via ORAL
  Filled 2022-01-25 (×3): qty 2

## 2022-01-25 MED ORDER — PROCHLORPERAZINE MALEATE 5 MG PO TABS: 5.0000 mg | ORAL_TABLET | Freq: Four times a day (QID) | ORAL | Status: DC | PRN

## 2022-01-25 MED ORDER — DIPHENHYDRAMINE HCL 12.5 MG/5ML PO ELIX: 12.5000 mg | ORAL_SOLUTION | Freq: Four times a day (QID) | ORAL | Status: DC | PRN

## 2022-01-25 MED ORDER — ALUM & MAG HYDROXIDE-SIMETH 200-200-20 MG/5ML PO SUSP: 30.0000 mL | ORAL | Status: DC | PRN

## 2022-01-25 MED ORDER — CAMPHOR-MENTHOL 0.5-0.5 % EX LOTN: TOPICAL_LOTION | CUTANEOUS | Status: DC | PRN
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
## 2022-01-25 NOTE — Progress Notes (Signed)
Inpatient Rehab Admissions Coordinator:   I spoke with Pt.'s insurer yesterday regarding his case for CIR. Nurse stated that clinicals were initially lost and that case was processed incorrectly (as if Monia Pouch has 15 days to render a decision, rather than the normal 72 hours). Nurse stated that that processing errors has been corrected and that they have 72 hours from yesterday morning to render a decision. I will continue to await insurance auth with plans to admit once it is received. Pt. Updated.   Megan Salon, MS, CCC-SLP Rehab Admissions Coordinator  [phone] (celll) [phone] (office)
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
## 2022-01-25 NOTE — TOC Transition Note (Signed)
Transition of Care [facility]) - CM/SW Discharge Note   Patient Details  Name: Norman Crawford MRN: [id number] Date of Birth: February 15, 1966  [facility] CM/SW Contact:  Glennon Mac, RN Phone Number: 01/25/2022, 4:09 PM   Clinical Narrative:    Insurance auth received for CIR admission, and bed available today.  Plan dc to [facility] IP Rehab when bed ready.    Final next level of care: IP Rehab Facility Barriers to Discharge: Barriers Resolved   Patient Goals and CMS Choice Patient states their goals for this hospitalization and ongoing recovery are:: to go home CMS Medicare.gov Compare Post Acute Care list provided to:: Patient Choice offered to / list presented to : Patient  Discharge Placement                       Discharge Plan and Services   Discharge Planning Services: CM Consult Post Acute Care Choice: IP Rehab                               Social Determinants of Health (SDOH) Interventions     Readmission Risk Interventions     No data to display         Quintella Baton, RN, BSN  Trauma/Neuro ICU Case Manager [phone]

## 2022-01-26 DIAGNOSIS — S32409S Unspecified fracture of unspecified acetabulum, sequela: Secondary | ICD-10-CM | POA: Diagnosis not present

## 2022-01-26 DIAGNOSIS — T07XXXA Unspecified multiple injuries, initial encounter: Secondary | ICD-10-CM

## 2022-01-26 LAB — CBC WITH DIFFERENTIAL/PLATELET
Abs Immature Granulocytes: 0.24 10*3/uL — ABNORMAL HIGH (ref 0.00–0.07)
Basophils Absolute: 0.1 10*3/uL (ref 0.0–0.1)
Basophils Relative: 1 %
Eosinophils Absolute: 0.2 10*3/uL (ref 0.0–0.5)
Eosinophils Relative: 2 %
HCT: 30.5 % — ABNORMAL LOW (ref 39.0–52.0)
Hemoglobin: 10.2 g/dL — ABNORMAL LOW (ref 13.0–17.0)
Immature Granulocytes: 2 %
Lymphocytes Relative: 15 %
Lymphs Abs: 1.9 10*3/uL (ref 0.7–4.0)
MCH: 31.1 pg (ref 26.0–34.0)
MCHC: 33.4 g/dL (ref 30.0–36.0)
MCV: 93 fL (ref 80.0–100.0)
Monocytes Absolute: 1 10*3/uL (ref 0.1–1.0)
Monocytes Relative: 8 %
Neutro Abs: 9.3 10*3/uL — ABNORMAL HIGH (ref 1.7–7.7)
Neutrophils Relative %: 72 %
Platelets: 400 10*3/uL (ref 150–400)
RBC: 3.28 MIL/uL — ABNORMAL LOW (ref 4.22–5.81)
RDW: 14.6 % (ref 11.5–15.5)
WBC: 12.7 10*3/uL — ABNORMAL HIGH (ref 4.0–10.5)
nRBC: 0.2 % (ref 0.0–0.2)

## 2022-01-26 LAB — COMPREHENSIVE METABOLIC PANEL
ALT: 35 U/L (ref 0–44)
AST: 43 U/L — ABNORMAL HIGH (ref 15–41)
Albumin: 2.7 g/dL — ABNORMAL LOW (ref 3.5–5.0)
Alkaline Phosphatase: 69 U/L (ref 38–126)
Anion gap: 9 (ref 5–15)
BUN: 31 mg/dL — ABNORMAL HIGH (ref 6–20)
CO2: 24 mmol/L (ref 22–32)
Calcium: 9.2 mg/dL (ref 8.9–10.3)
Chloride: 102 mmol/L (ref 98–111)
Creatinine, Ser: 1.25 mg/dL — ABNORMAL HIGH (ref 0.61–1.24)
GFR, Estimated: >60 mL/min (ref >60)
Glucose, Bld: 105 mg/dL — ABNORMAL HIGH (ref 70–99)
Potassium: 4 mmol/L (ref 3.5–5.1)
Sodium: 135 mmol/L (ref 135–145)
Total Bilirubin: 1.8 mg/dL — ABNORMAL HIGH (ref 0.3–1.2)
Total Protein: 6.7 g/dL (ref 6.5–8.1)

## 2022-01-26 LAB — URINALYSIS, ROUTINE W REFLEX MICROSCOPIC
Bilirubin Urine: NEGATIVE
Glucose, UA: NEGATIVE mg/dL
Hgb urine dipstick: NEGATIVE
Ketones, ur: NEGATIVE mg/dL
Leukocytes,Ua: NEGATIVE
Nitrite: NEGATIVE
Protein, ur: NEGATIVE mg/dL
Specific Gravity, Urine: 1.017 (ref 1.005–1.030)
pH: 6 (ref 5.0–8.0)

## 2022-01-26 MED ORDER — ENSURE ENLIVE PO LIQD: 237.0000 mL | Freq: Three times a day (TID) | ORAL | Status: DC

## 2022-01-26 MED ORDER — SULFAMETHOXAZOLE-TRIMETHOPRIM 800-160 MG PO TABS
1.0000 | ORAL_TABLET | Freq: Two times a day (BID) | ORAL | Status: DC
  Administered 2022-01-26 – 2022-01-27 (×3): 1 via ORAL
  Filled 2022-01-26 (×3): qty 1

## 2022-01-26 MED ORDER — ADULT MULTIVITAMIN W/MINERALS CH
1.0000 | ORAL_TABLET | Freq: Every day | ORAL | Status: DC
  Administered 2022-01-26 – 2022-02-08 (×14): 1 via ORAL
  Filled 2022-01-26 (×14): qty 1

## 2022-01-26 MED ORDER — LIDOCAINE HCL URETHRAL/MUCOSAL 2 % EX GEL: 1.0000 | CUTANEOUS | Status: DC | PRN

## 2022-01-26 MED ORDER — ESCITALOPRAM OXALATE 10 MG PO TABS
10.0000 mg | ORAL_TABLET | Freq: Every day | ORAL | Status: DC
  Administered 2022-01-26 – 2022-02-07 (×13): 10 mg via ORAL
  Filled 2022-01-26 (×13): qty 1

## 2022-01-26 MED ORDER — TAMSULOSIN HCL 0.4 MG PO CAPS
0.4000 mg | ORAL_CAPSULE | Freq: Every day | ORAL | Status: DC
  Administered 2022-01-27 – 2022-02-03 (×8): 0.4 mg via ORAL
  Filled 2022-01-26 (×8): qty 1

## 2022-01-26 NOTE — Progress Notes (Signed)
Inpatient Rehabilitation  Patient information reviewed and entered into eRehab system by Nashali Ditmer M. Yusif Gnau, M.A., CCC/SLP, PPS Coordinator.  Information including medical coding, functional ability and quality indicators will be reviewed and updated through discharge.    

## 2022-01-26 NOTE — Progress Notes (Signed)
Inpatient Rehabilitation Admission Medication Review by a Pharmacist  A complete drug regimen review was completed for this patient to identify any potential clinically significant medication issues.  High Risk Drug Classes Is patient taking? Indication by Medication  Antipsychotic Yes Compazine- n/v  Anticoagulant Yes Apixaban- VTE prophylaxis s/p ortho surgery  Antibiotic Yes Bactroban oint- MRSA eradication  Opioid Yes OxyIR- acute pain  Antiplatelet No   Hypoglycemics/insulin No   Vasoactive Medication Yes Cozaar, HCTZ, atenolol- hypertension  Chemotherapy No   Other Yes Allopurinol- gout prophylaxis Lipitor- HLD Melatonin- sleep Robaxin- muscle spasms Benadryl- itching Trazodone- sleep     Type of Medication Issue Identified Description of Issue Recommendation(s)  Drug Interaction(s) (clinically significant)     Duplicate Therapy     Allergy     No Medication Administration End Date     Incorrect Dose     Additional Drug Therapy Needed     Significant med changes from prior encounter (inform family/care partners about these prior to discharge).    Other  PTA meds: All PTA meds have been continued on admission to CIR     Clinically significant medication issues were identified that warrant physician communication and completion of prescribed/recommended actions by midnight of the next day:  No   Time spent performing this drug regimen review (minutes):  30   Lezli Danek BS, PharmD, BCPS Clinical Pharmacist 01/26/2022 7:19 AM  Contact: [phone] after 3 PM  "Be curious, not judgmental..." -Debbora Dus

## 2022-01-26 NOTE — Plan of Care (Addendum)
Met with patient whom was resting.  Provided education for pelvic fracture, incision care, gout: uric acid 3.3. Vit D with dietary modifications. All needs met. Bed alarm and functioning.
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
## 2022-01-26 NOTE — Progress Notes (Signed)
Still settling in on unit. Slept intermittently due to inability to void and generalized body ache. Bladder scanned volume after  2 am was >500 mls. Intermittent urinary catheter tunneled via urethra under aseptic technique and >800 mls of clear yellow/amber urine drained from the bladder with minimal discomfort. Will advocate for lidocaine gel 2% from medical team with next catheter procedure should patient need it.  Denies SOB, pain, nausea, or dizziness. Had a smear/minimal bowel movement this am. Awaiting therapy evaluation. Safety maintained.
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
## 2022-01-26 NOTE — Progress Notes (Signed)
Inpatient Rehabilitation Center Individual Statement of Services  Patient Name:  MATHHEW BUYSSE  Date:  01/26/2022  Welcome to the Inpatient Rehabilitation Center.  Our goal is to provide you with an individualized program based on your diagnosis and situation, designed to meet your specific needs.  With this comprehensive rehabilitation program, you will be expected to participate in at least 3 hours of rehabilitation therapies Monday-Friday, with modified therapy programming on the weekends.  Your rehabilitation program will include the following services:  Physical Therapy (PT), Occupational Therapy (OT), 24 hour per day rehabilitation nursing, Therapeutic Recreaction (TR), Care Coordinator, Rehabilitation Medicine, Nutrition Services, and Pharmacy Services  Weekly team conferences will be held on Wednesday to discuss your progress.  Your Inpatient Rehabilitation Care Coordinator will talk with you frequently to get your input and to update you on team discussions.  Team conferences with you and your family in attendance may also be held.  Expected length of stay: 14-16 days  Overall anticipated outcome: supervision-min level  Depending on your progress and recovery, your program may change. Your Inpatient Rehabilitation Care Coordinator will coordinate services and will keep you informed of any changes. Your Inpatient Rehabilitation Care Coordinator's name and contact numbers are listed  below.  The following services may also be recommended but are not provided by the Inpatient Rehabilitation Center:  Driving Evaluations Home Health Rehabiltiation Services Outpatient Rehabilitation Services Vocational Rehabilitation   Arrangements will be made to provide these services after discharge if needed.  Arrangements include referral to agencies that provide these services.  Your insurance has been verified to be:   [employer] Preferred Your primary doctor is:  Deatra James  Pertinent  information will be shared with your doctor and your insurance company.  Inpatient Rehabilitation Care Coordinator:  Dossie Der, [place] [phone] or Luna Glasgow  Information discussed with and copy given to patient by: [name], 01/26/2022, 10:51 AM

## 2022-01-26 NOTE — Progress Notes (Signed)
Patient exhibiting depressed mood and requests anti-depressant. Will start Lexapro. LPN has placed referral for neuropsych eval.
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
## 2022-01-27 DIAGNOSIS — S32409S Unspecified fracture of unspecified acetabulum, sequela: Secondary | ICD-10-CM | POA: Diagnosis not present

## 2022-01-27 DIAGNOSIS — T07XXXA Unspecified multiple injuries, initial encounter: Secondary | ICD-10-CM | POA: Diagnosis not present

## 2022-01-27 LAB — CBC
HCT: 30.4 % — ABNORMAL LOW (ref 39.0–52.0)
Hemoglobin: 10.3 g/dL — ABNORMAL LOW (ref 13.0–17.0)
MCH: 31.3 pg (ref 26.0–34.0)
MCHC: 33.9 g/dL (ref 30.0–36.0)
MCV: 92.4 fL (ref 80.0–100.0)
Platelets: 462 10*3/uL — ABNORMAL HIGH (ref 150–400)
RBC: 3.29 MIL/uL — ABNORMAL LOW (ref 4.22–5.81)
RDW: 14.8 % (ref 11.5–15.5)
WBC: 15.2 10*3/uL — ABNORMAL HIGH (ref 4.0–10.5)
nRBC: 0 % (ref 0.0–0.2)

## 2022-01-27 LAB — URINE CULTURE: Culture: NO GROWTH

## 2022-01-27 MED ORDER — DOXYCYCLINE HYCLATE 100 MG PO TABS
100.0000 mg | ORAL_TABLET | Freq: Two times a day (BID) | ORAL | Status: AC
  Administered 2022-01-27 – 2022-01-31 (×10): 100 mg via ORAL
  Filled 2022-01-27 (×10): qty 1

## 2022-01-27 MED ORDER — DOXYCYCLINE HYCLATE 100 MG PO TABS
100.0000 mg | ORAL_TABLET | Freq: Two times a day (BID) | ORAL | Status: DC
Start: 2022-01-27 — End: 2022-01-27

## 2022-01-27 MED ORDER — ATENOLOL 25 MG PO TABS
50.0000 mg | ORAL_TABLET | Freq: Every morning | ORAL | Status: DC
  Administered 2022-01-28 – 2022-01-31 (×2): 50 mg via ORAL
  Filled 2022-01-27 (×6): qty 2

## 2022-01-27 NOTE — Plan of Care (Signed)
  Problem: RH Balance Goal: LTG Patient will maintain dynamic sitting balance (PT) Description: LTG:  Patient will maintain dynamic sitting balance with assistance during mobility activities (PT) Flowsheets (Taken 01/27/2022 0605) LTG: Pt will maintain dynamic sitting balance during mobility activities with:: Independent with assistive device    Problem: RH Bed Mobility Goal: LTG Patient will perform bed mobility with assist (PT) Description: LTG: Patient will perform bed mobility with assistance, with/without cues (PT). Flowsheets (Taken 01/27/2022 0605) LTG: Pt will perform bed mobility with assistance level of: Supervision/Verbal cueing   Problem: RH Bed to Chair Transfers Goal: LTG Patient will perform bed/chair transfers w/assist (PT) Description: LTG: Patient will perform bed to chair transfers with assistance (PT). Flowsheets (Taken 01/27/2022 0605) LTG: Pt will perform Bed to Chair Transfers with assistance level: Minimal Assistance - Patient > 75%   Problem: RH Car Transfers Goal: LTG Patient will perform car transfers with assist (PT) Description: LTG: Patient will perform car transfers with assistance (PT). Flowsheets (Taken 01/27/2022 0605) LTG: Pt will perform car transfers with assist:: Moderate Assistance - Patient 50 - 74%   Problem: RH Furniture Transfers Goal: LTG Patient will perform furniture transfers w/assist (OT/PT) Description: LTG: Patient will perform furniture transfers  with assistance (OT/PT). Flowsheets (Taken 01/27/2022 0605) LTG: Pt will perform furniture transfers with assist:: Moderate Assistance - Patient 50 - 74%   Problem: RH Wheelchair Mobility Goal: LTG Patient will propel w/c in controlled environment (PT) Description: LTG: Patient will propel wheelchair in controlled environment, # of feet with assist (PT) Flowsheets (Taken 01/27/2022 0605) LTG: Pt will propel w/c in controlled environ  assist needed:: Supervision/Verbal cueing LTG: Propel w/c  distance in controlled environment: 150ft with 1 arm drive WC Goal: LTG Patient will propel w/c in home environment (PT) Description: LTG: Patient will propel wheelchair in home environment, # of feet with assistance (PT). Flowsheets (Taken 01/27/2022 0605) LTG: Propel w/c distance in home environment: 70ft with 1 arm drive WC

## 2022-01-27 NOTE — Progress Notes (Signed)
Slept well last night. Condom cath applied just for overnight to assist with voiding. Patient voided and bag emptied. To be discontinued this am. Denies pain. Safety maintained at all times.
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
## 2022-01-27 NOTE — Progress Notes (Signed)
Occupational Therapy Session Note  Patient Details  Name: Norman Crawford MRN: [id number] Date of Birth: 08/22/1965  Today's Date: 01/27/2022 OT Individual Time: 2992-4268 OT Individual Time Calculation (min): 49 min  Missed 26 mins d/t low BP and increased fatigue   Short Term Goals: Week 1:  OT Short Term Goal 1 (Week 1): Pt will increase bed mobility to Supervision while maintaining WB restrictions without requiring VC to follow. OT Short Term Goal 2 (Week 1): Patient will complete functional transfer; using preferred technique; with Min A. OT Short Term Goal 3 (Week 1): Pt will increase LB bathing by completing task while seated in shower with Min A.  Skilled Therapeutic Interventions/Progress Updates:  Pt greeted supine in bed, having just finished with PT. Pt reports not feeling well after sitting up in w/c for > 1 hr. PT reports pt was diaphoretic and returned pt back to bed for safety.     BPs assessed during session:  supine BP at start of session:  95/54( 67) HR 58   Pt completed below RUE therex from supine with level 3 theraband to increase strength and endurance:                X20 sholder flexion X20 chest presses X20 bicep curls X20 diagonal reaches in PNF pattern  Pt appreciative of therex, pt reports feeling better and willing to try and sit EOB.  CGA for supine>sit with + time, pt exiting to L side with use of OH trapeze and Side rail, placed pillow  under L elbow to assist with elevating trunk from sidelying>sit. Reassessed BP from EOB:  98/40  Pt then reports general malaise requesting to lay back down.  MIN A for sit>supine needing assist to elevate LLE back to bed.  Pt left supine in bed with BLEs elevated and all needs within reach  RN present during session and aware of hypotensive episodes.    Therapy Documentation Precautions:  Precautions Precautions: Fall Restrictions Weight Bearing Restrictions: Yes LUE Weight Bearing: Non weight bearing RLE  Weight Bearing: Non weight bearing LLE Weight Bearing: Non weight bearing  Pain: no pain reported during session     Therapy/Group: Individual Therapy  Pollyann Glen [facility] 01/27/2022, 3:20 PM
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
## 2022-01-27 NOTE — Progress Notes (Signed)
Occupational Therapy Session Note  Patient Details  Name: Norman Crawford MRN: [id number] Date of Birth: 11-15-1965  Today's Date: 01/27/2022 OT Individual Time: 1120-1200 OT Individual Time Calculation (min): 40 min    Short Term Goals: Week 1:  OT Short Term Goal 1 (Week 1): Pt will increase bed mobility to Supervision while maintaining WB restrictions without requiring VC to follow. OT Short Term Goal 2 (Week 1): Patient will complete functional transfer; using preferred technique; with Min A. OT Short Term Goal 3 (Week 1): Pt will increase LB bathing by completing task while seated in shower with Min A.  Skilled Therapeutic Interventions/Progress Updates:  Skilled OT intervention completed with focus on toileting needs, functional transfers, WB restriction education. Pt received semi-upright, with report that ortho removed hard cast on L wrist, with L wrist and surgical bar visible and exposed. Pt reported ortho didn't report plan for recovering or splinting, with therapist communicating to care team in regards to safety concern with wrist surgical bar being exposed/during therapy. MD in room to assess, with report to cover bar with gauze then proceed with NWB status as normal, with plan to follow up about orders for splinting/care of the wrist. Therapist wrapped with gauze for protection with transfers.  Pt reported urge to have BM, with pt/therapist apprehensive about going to [facility] with bar exposure. Opted for bed pan usage 2/2 urgency as well. Pt was able to roll > R with use of R arm only, with cues needed throughout rolling, toileting and transfers for NWB through BLE as pt with difficulty maintaining this. Pt was continent of large BM (with privacy given) and urine output measured in urinal for nurse's charting.  Max A needed for toileting with total A for pericare, with pt able to assist with donning pants over hips on R, however assist needed for L side 2/2 NWB/usage of digits. Supine >  EOB with min A for trunk elevation with use of folded pillow under L elbow for pushing up.   Slideboard transfer > L for utilizing RUE for leverage, with total A for board placement, mod A for sliding, and 2nd person utilized for safety to stabilize w/c.  Pt is very motivated, however slightly anxious and with questions regarding WB restrictions. Therapist educating pt on the difference between resting leg on leg rest or floor vs weight bearing/pressing through the floor or foot rests with key transfers to be more conscious of BLE and L wrist placement including with SB transfers, rolling in bed etc. Pt remained seated in w/c with BLE leg rests donned, ice chips per request and all immediate needs met at end of session.   Therapy Documentation Precautions:  Precautions Precautions: Fall Restrictions Weight Bearing Restrictions: Yes LUE Weight Bearing: Non weight bearing (Ok to WB through elbow) RLE Weight Bearing: Non weight bearing LLE Weight Bearing: Non weight bearing    Therapy/Group: Individual Therapy  Blase Mess, MS, OTR/L  01/27/2022, 12:43 PM
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
## 2022-01-27 NOTE — Progress Notes (Signed)
Physical Therapy Session Note  Patient Details  Name: Norman Crawford MRN: [id number] Date of Birth: 1966/01/13  Today's Date: 01/27/2022 PT Individual Time: 1301-1415 PT Individual Time Calculation (min): 74 min   Short Term Goals: Week 1:  PT Short Term Goal 1 (Week 1): Pt will perform bed mobiltiy with min assist PT Short Term Goal 2 (Week 1): Pt will trasnfer to and from [facility] with mod assist consisently PT Short Term Goal 3 (Week 1): Pt will propell WC with min assist >1104ft  Skilled Therapeutic Interventions/Progress Updates:  Patient seated upright in w/c on entrance to room. Patient alert and agreeable to PT session.   Patient with mild/ moderate pain complaint at start of session. Self addresses with repositioning in w/c using RUE. Pt requests to transfer to [facility] in order to move bowels. Bari BSC with drop arm located and brought to room. Positioned pt's w/c for appropriate transfer to R side using SB. Pt is able to assist using RUE, L elbow on yoga blocks and with Bil heels resting on floor with intermittent ability to raise from floor. ModA to place SB. Transfer requires extra time  and Min/ ModA to initiate with MinA to complete. Pt is able to weight shift in order to initiate removal of shorts in seated position with MinA to complete. Pericare performed with supervision. While seated on BSC, pt becomes diaphoretic and requests use of lift to return to bed as he does not have the strength/ activity tolerance to use SB back. Maxi move sling placed with Mod/ MaxA and pt able to assist with weight shift to position. Positioned in bed with +2 for safety and to train RN re: use of MaxiMove. Pt is able to assist with move toward [facility] with use of triangle bar provided in acute care. BLE positioned in elevated position to assist with pt's improvement of symptoms.   Recommended to RN that pt's bed alarm not be activated as long as bed is in lowest height position and all 4 rails are elevated so that pt  can utilize triangle bar for positioning in bed. RN in agreement and to clear with charge nurse and relay to next nurse.  Patient supine  in bed at end of session with brakes locked, no alarm set, and all needs within reach.   Therapy Documentation Precautions:  Precautions Precautions: Fall Restrictions Weight Bearing Restrictions: Yes LUE Weight Bearing: Non weight bearing RLE Weight Bearing: Non weight bearing LLE Weight Bearing: Non weight bearing General:   Vital Signs: Therapy Vitals Temp: 98 F (36.7 C) Temp Source: Oral Pulse Rate: 63 Resp: 16 BP: (!) 95/54 Patient Position (if appropriate): Lying Oxygen Therapy SpO2: 95 % O2 Device: Room Air Pain: Pt expresses pain with movements but no numerical rating provided. Increased pain during movements that decreases with rest.  Therapy/Group: Individual Therapy  Loel Dubonnet PT, DPT, CSRS 01/27/2022, 5:37 PM

## 2022-01-28 DIAGNOSIS — T07XXXA Unspecified multiple injuries, initial encounter: Secondary | ICD-10-CM | POA: Diagnosis not present

## 2022-01-28 LAB — CBC
HCT: 30.8 % — ABNORMAL LOW (ref 39.0–52.0)
Hemoglobin: 10.2 g/dL — ABNORMAL LOW (ref 13.0–17.0)
MCH: 30.9 pg (ref 26.0–34.0)
MCHC: 33.1 g/dL (ref 30.0–36.0)
MCV: 93.3 fL (ref 80.0–100.0)
Platelets: 513 10*3/uL — ABNORMAL HIGH (ref 150–400)
RBC: 3.3 MIL/uL — ABNORMAL LOW (ref 4.22–5.81)
RDW: 14.8 % (ref 11.5–15.5)
WBC: 15.8 10*3/uL — ABNORMAL HIGH (ref 4.0–10.5)
nRBC: 0 % (ref 0.0–0.2)

## 2022-01-28 MED ORDER — SODIUM CHLORIDE 0.45 % IV BOLUS
500.0000 mL | Freq: Once | INTRAVENOUS | Status: AC
  Administered 2022-01-28: 500 mL via INTRAVENOUS

## 2022-01-28 MED ORDER — HYDROCHLOROTHIAZIDE 25 MG PO TABS
25.0000 mg | ORAL_TABLET | Freq: Every day | ORAL | Status: DC
  Administered 2022-01-29: 25 mg via ORAL
  Filled 2022-01-28 (×2): qty 1

## 2022-01-28 MED ORDER — LOSARTAN POTASSIUM 50 MG PO TABS
50.0000 mg | ORAL_TABLET | Freq: Every day | ORAL | Status: DC
  Administered 2022-01-29: 50 mg via ORAL
  Filled 2022-01-28 (×2): qty 1

## 2022-01-28 NOTE — Progress Notes (Signed)
Occupational Therapy Session Note  Patient Details  Name: Norman Crawford MRN: [id number] Date of Birth: [birth year]  Today's Date: 01/28/2022 OT Individual Time: 3662-9476 & 1510 - 1600 OT Individual Time Calculation (min): 55 min & 50 min   Short Term Goals: Week 1:  OT Short Term Goal 1 (Week 1): Pt will increase bed mobility to Supervision while maintaining WB restrictions without requiring VC to follow. OT Short Term Goal 2 (Week 1): Patient will complete functional transfer; using preferred technique; with Min A. OT Short Term Goal 3 (Week 1): Pt will increase LB bathing by completing task while seated in shower with Min A.  First Session [phone] - 0925) - Skilled Therapeutic Interventions/Progress Updates:  Pt awake in bed upon OT arrival to the room. Pt reports, "I'm kind of worried about this (L wrist)." Pt in agreement for OT session.   Therapy Documentation Precautions:  Precautions Precautions: Fall Restrictions Weight Bearing Restrictions: Yes LUE Weight Bearing: Non weight bearing RLE Weight Bearing: Non weight bearing LLE Weight Bearing: Non weight bearing Vital Signs: Please see "Flowsheet" for most recent vitals charted by nursing staff.  Pain: Pain Assessment Pain Scale: 0-10 Pain Score: 1  Pain Location: Leg Pain Orientation: Left Pain Descriptors / Indicators: Aching Pain Onset: On-going Multiple Pain Sites: No  ADL: Grooming: Setup (Pt able to complete oral and facial hygiene with set-up assist while seated in the w/c at the sink.) Where Assessed-Grooming: Wheelchair, Sitting at sink Toileting:  (Pt declines need at this time, however, reports potential need to complete after OT session therefore pt transferred back to bed for improved ease with toileting tasks with nursing staff.) ADL Comments: Pt already dressed and reports desire to take a shower. OT provides education on the need for increased time to completing showering task and that OT needs to fine  appropriate showering equipment for safety with transfer. Pt able to complete supine <> sit transfer with L exit from bed with supervision and use of RUE on trapeze and bed rails to management trunk. Pt able to complete slide board transfer to the L from EOB <> w/c with minimal - moderate assistance. Increased assistance needed for initial scoot and hip clearance. Once seated in w/c pt progressively reports s/s of orthostatic hypotension. BP was taken and was 106/68. Pt transferred back to bed to relieve symptoms and prep for toileting if needed after OT session.  Functional Mobility & Transfer Training: Education provided to pt on proper slide board and transfer techniques to improve independence and safety with transfer. Pt able to perform slide board transfer to the L from EOB > w/c with moderate assist x 1 & SBA x 1 for safety to secure w/c and ensure NWB to LUE. Pt then reports possible need to perform toileting tasks after OT session and reports increased comfort with performing at bed level. Pt then able to complete another slide board transfer to the L from w/c > EOB with moderate assistance x 1, progressing to midline assistance.   LUE Protection: Pt noted to not have splint or cast on LUE. Pt reports potentially snagging the pin on the sheets while sleeping which OT noted a rip in the gauze covering LUE incision and a rip in the bed sheet. OT then applied Ace Wrap to LUE to provide extra protection to the incision and pins in L wrist. OT consulted with RN on need to contact ortho physician to determine what type of splint/cast is needed to protect LUE and then  what process ortho would prefer for getting this applied. RN verbalizes understanding.   DME to Prep for Furtuer ADL's: OT provides education to pt on need to utilize specific DME to perform showering tasks in order to improve safety with transfer, manage orthostatic hypotension, and ensure proper positioning. OT goes to equipment room in  attempts to find appropriate shower chair for pt to complete bathing tasks in the shower during next OT session if able. OT only able to find a TIS rolling shower chair with 250# weight limit and pt's chart notes that pt weighs 255#. OT plans to find shower chair in prep for future OT sessions and to provide education to pt on the technique for transfer and bathing tasks to improve independence, safety, and pt understanding on the process prior to completing a shower with OT. Pt in agreement for OT to locate DME needed for showering tasks in prep for an ADL routine session for next OT session.   Pt returned to bed at end of session. Pt left resting comfortably in bed with personal belongings and call light within reach, bed alarm on and activated, bed in low position, 3 bed rails up, and comfort needs attended to.   --------------------------------------------------------------------------------------------------------------------------------------------------------------------------------------------------------------------------   Second Session (1510 - 1600) - Skilled Therapeutic Interventions/Progress Updates:  Pt awake in bed upon OT arrival to the room. Pt reports, "This is like my worst fear come true," referring to how the potential of needing assistance from spouse could change the relationship dynamic with spouse. Pt in agreement for OT session.   Therapy Documentation Pain:  01/28/22 1510  Pain Assessment  Pain Scale 0-10  Pain Score 0     ADL's & DC Planning: Pt showed OT ramp that is being built and a video of the home set-up of pt's home from front door > living room > bedroom > bathroom. Pt noted to have a tub-shower with the shower head being on the same side of the toilet causing decreased space for pt to perform slide board transfer to a tub-bench to face the shower head. OT provided education to pt on recommendation of having a tub-transfer bench in order to safely perform a  tub transfer. OT provided education that a tub-bench could be placed backwards and then use a hand-held shower hose to increase the amount of space in order to manage w/c and perform a slideboard transfer from w/c <> tub-bench. Pt reports desire to shower during OT sessions. OT retrieved a reclining shower chair to have in pt's room to prep for a shower if able to complete on next OT session. OT provided a skilled demo on AP transfer techniques and talked throughout the ADL process to complete a shower. Pt verbalizes understanding.   Orthostatic Hypotension Education with Mobility: Education provided to pt on how dehydration and decreased movement can both contribute to orthostatic hypotension. OT educated pt on how our body has to re-regulate with positional changes and this can cause increased difficulty whenever we have experiences being in bed for a prolonged time. Educated pt that getting fluids and ensuring proper fluid intake paired with mobility with therapy can improve pt's tolerance to positional changes. Pt verbalizes understanding.   Psychosocial Support: Pt initiated conversation around concerns regarding sexuality and relationship roles/dynamics after this injury and throughout the recovery process. OT provides empathetic listening and education on the role of OT within the rehab process. Educated pt on how OT will practice various transfer and compensatory techniques to improve independence with  ADLs as well as provide thorough education for the techniques to complete transfers/ADL's to pt can have improved independence with directing care if increased physical assistance is still needed at the time of DC. Educated pt on importance of communicating concerns with OT and OT working with pt to find ways to improve pt's independence after DC. Pt verbalizes understanding. Educated pt on the role of neuropsych in rehab, however, pt reports not being interested at this time and wants to see how thing  progress with therapy. Encouraged pt to communicate if pt would be interested in this service.   Pt remained in bed at end of session. Pt left resting comfortably in bed with personal belongings and call light within reach, bed alarm on and activated, bed in low position, 3 bed rails up, and comfort needs attended to.   Therapy/Group: Individual Therapy  Lavera Guise 01/28/2022, 10:15 AM
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
## 2022-01-28 NOTE — Progress Notes (Signed)
Physical Therapy Session Note  Patient Details  Name: Norman Crawford MRN: [id number] Date of Birth: 1966-05-10  Today's Date: 01/28/2022 PT Individual Time: 1006-1100  and 1305-1408 PT Individual Time Calculation (min): 54 min and 63 min   Short Term Goals: Week 1:  PT Short Term Goal 1 (Week 1): Pt will perform bed mobiltiy with min assist PT Short Term Goal 2 (Week 1): Pt will trasnfer to and from [facility] with mod assist consisently PT Short Term Goal 3 (Week 1): Pt will propell WC with min assist >112f Week 2:    Week 3:     Skilled Therapeutic Interventions/Progress Updates:  Session 1.  Pt received supine in bed and agreeable to PT.  Noted to have no splint or cast on the LUE. Spoke with MD. Orders for bed level therapy unless pt has cast   Pt performed supine BLE therex.   Rolling R and L.   Pt left supine in bed with call bell in reach and all needs met.    Session 2.  Pt received supine in bed and agreeable to PT. Supine>sit transfer with *** assist and ***cues   [facility] Transported to orthogym.   UBE. Noted mild complexion change.   Transfer to mat table. Noted lightheadedness sitting EOB. Pt transferred to supine:  Orthostatic assessment:  Supine: 92/45.  Sitting: 80/51. Returned tyo supine  Supine: 88/54.  thigh high teds donned. Supine: 93/56.   Performed WC mobility then reassessed.  Sitting in [place] 66/53.  Transferred to bed placed in supine with legs elevated. 82/54.   RN Aware and notified MD.         Therapy Documentation Precautions:  Precautions Precautions: Fall Restrictions Weight Bearing Restrictions: Yes LUE Weight Bearing: Non weight bearing RLE Weight Bearing: Non weight bearing LLE Weight Bearing: Non weight bearing General:   Vital Signs: Therapy Vitals Temp: 98.8 F (37.1 C) Temp Source: Oral Pulse Rate: 62 Resp: 18 BP: 99/65 Patient Position (if appropriate): Lying Oxygen  Therapy SpO2: 97 % O2 Device: Room Air Pain: Pain Assessment Pain Scale: 0-10 Pain Score: 0-No pain Mobility:   Locomotion :    Trunk/Postural Assessment :    Balance:   Exercises:   Other Treatments:      Therapy/Group: Individual Therapy  ALorie Phenix8/05/2022, 6:20 PM
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
## 2022-01-28 NOTE — Progress Notes (Signed)
Orthopedic Tech Progress Note Patient Details:  Norman Crawford 1965-06-30 [id number]  Ortho Devices Type of Ortho Device: Sugartong splint, Arm sling Ortho Device/Splint Location: LUE Ortho Device/Splint Interventions: Application   Post Interventions Patient Tolerated: Well  Norman Crawford 01/28/2022, 11:28 AM

## 2022-01-29 DIAGNOSIS — T07XXXA Unspecified multiple injuries, initial encounter: Secondary | ICD-10-CM | POA: Diagnosis not present
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
## 2022-01-30 DIAGNOSIS — S32409S Unspecified fracture of unspecified acetabulum, sequela: Secondary | ICD-10-CM | POA: Diagnosis not present

## 2022-01-30 DIAGNOSIS — T07XXXA Unspecified multiple injuries, initial encounter: Secondary | ICD-10-CM | POA: Diagnosis not present

## 2022-01-30 LAB — CBC
HCT: 32.8 % — ABNORMAL LOW (ref 39.0–52.0)
Hemoglobin: 10.9 g/dL — ABNORMAL LOW (ref 13.0–17.0)
MCH: 31.1 pg (ref 26.0–34.0)
MCHC: 33.2 g/dL (ref 30.0–36.0)
MCV: 93.4 fL (ref 80.0–100.0)
Platelets: 554 10*3/uL — ABNORMAL HIGH (ref 150–400)
RBC: 3.51 MIL/uL — ABNORMAL LOW (ref 4.22–5.81)
RDW: 14.7 % (ref 11.5–15.5)
WBC: 10.7 10*3/uL — ABNORMAL HIGH (ref 4.0–10.5)
nRBC: 0 % (ref 0.0–0.2)

## 2022-01-30 LAB — BASIC METABOLIC PANEL
Anion gap: 11 (ref 5–15)
BUN: 40 mg/dL — ABNORMAL HIGH (ref 6–20)
CO2: 21 mmol/L — ABNORMAL LOW (ref 22–32)
Calcium: 9.2 mg/dL (ref 8.9–10.3)
Chloride: 100 mmol/L (ref 98–111)
Creatinine, Ser: 1.53 mg/dL — ABNORMAL HIGH (ref 0.61–1.24)
GFR, Estimated: 53 mL/min — ABNORMAL LOW (ref >60)
Glucose, Bld: 104 mg/dL — ABNORMAL HIGH (ref 70–99)
Potassium: 3.8 mmol/L (ref 3.5–5.1)
Sodium: 132 mmol/L — ABNORMAL LOW (ref 135–145)

## 2022-01-30 MED ORDER — HYDROCHLOROTHIAZIDE 12.5 MG PO TABS
12.5000 mg | ORAL_TABLET | Freq: Every day | ORAL | Status: DC
  Administered 2022-01-31: 12.5 mg via ORAL
  Filled 2022-01-30: qty 1

## 2022-01-30 MED ORDER — LOSARTAN POTASSIUM 50 MG PO TABS
50.0000 mg | ORAL_TABLET | Freq: Every day | ORAL | Status: DC
  Administered 2022-01-31: 50 mg via ORAL
  Filled 2022-01-30: qty 1

## 2022-01-30 NOTE — Progress Notes (Signed)
Physical Therapy Session Note  Patient Details  Name: Norman Crawford MRN: [id number] Date of Birth: 1965-06-21  Today's Date: 01/30/2022 PT Individual Time: 8251-8984 PT Individual Time Calculation (min): 60 min   Short Term Goals: Week 1:  PT Short Term Goal 1 (Week 1): Pt will perform bed mobiltiy with min assist PT Short Term Goal 2 (Week 1): Pt will trasnfer to and from [facility] with mod assist consisently PT Short Term Goal 3 (Week 1): Pt will propell WC with min assist >155f  Skilled Therapeutic Interventions/Progress Updates:      Pt supine in bed to start with RN at bedside for morning Rx. Pt agreeable to therapy and reports "minimal" pain in pelvis. Discussed general DC planning - pt confirms moving back to 1 level home with ramped entrance, family member has made it w/c accessible with widened doorways. Pt reports wife works from home and can provide intermittent assist, also reports a "strong support system" from other friends/family.  Supine<>sitting EOB with supervision, hospital bed features. Compliant with NWB restrictions in BLE and elbow only on LUE. Able to sit EOB with supervision without UE support for >5 minutes to allow tolerance to upright (pt reporting hypotension earlier). SB transfer towards his L side with totalA for board placement and minA for guiding hips along board - pt compliant with WB restrictions throughout transfer.   Pt wheeled himself in 1-wheel drive wheelchair to sink where he completed a few self care tasks - brushing teeth, shaving face, and applying deodorant to both underarms without assist.   Pt propelled himself with supervision in 1-wheel drive chair from his room to main rehab gym, ~1232f Pt able to manage turns and straight paths without assist or veering using the 1-wheel drive system.  Completed SB transfer towards his L from w/c to mat table with assist for board placement and min cues for maintaining NWB on BLE. Educated him on w/c  parts/management and setup for wheelchair during transfers. Anticipate he will require assist for w/c setup/management due to NWB restrictions in LUE.  Once on mat table, pt reporting feeling a little lightheaded and requested to lie down - assist for comfort and pt reports this resolved within a few minutes.   Supine there-ex completed as follows: -1x10 heel slides -1x10 SLR -1x10 hip abd  -2x30 sec "flutter kicks" unilaterally at a time (unable to lift BLE off table simultaneously) .   Pt inquiring about his estimated LOS (2 weeks) but informed him that he's on track to meet LTG quicker than that and his therapy will be limited due to weight bearing restrictions on 3 limbs. Educated him on the focus of therapy is to maximize functional independence with bed mobility, transfers, and wheelchair mobility.   Assisted to sitting EOB with modA for trunk support. Once EOB pt needing time for acclimating to upright due to mild lightheadedness. Completed SB transfer with minA from mat table to w/c, towards his R side. Min cues for keeping BLE off ground to prevent weight bearing.   Pt returned to his room in w/c with totalA for time management. Concluded session seated in w/c with call bell in lap and all needs met.   Therapy Documentation Precautions:  Precautions Precautions: Fall Restrictions Weight Bearing Restrictions: Yes LUE Weight Bearing: Non weight bearing RLE Weight Bearing: Non weight bearing LLE Weight Bearing: Non weight bearing General:     Therapy/Group: Individual Therapy  ChAlger Simons/14/2023, 7:52 AM

## 2022-01-30 NOTE — Progress Notes (Signed)
Occupational Therapy Session Note  Patient Details  Name: Norman Crawford MRN: [id number] Date of Birth: Jan 20, 1966  Today's Date: 01/30/2022 OT Individual Time: 1444-5848 OT Individual Time Calculation (min): 75 min    Short Term Goals: Week 1:  OT Short Term Goal 1 (Week 1): Pt will increase bed mobility to Supervision while maintaining WB restrictions without requiring VC to follow. OT Short Term Goal 2 (Week 1): Patient will complete functional transfer; using preferred technique; with Min A. OT Short Term Goal 3 (Week 1): Pt will increase LB bathing by completing task while seated in shower with Min A.  Skilled Therapeutic Interventions/Progress Updates:    Upon OT arrival, pt supine in bed reporting no pain and is agreeable to OT treatment sesison. OT intervention with a focus on functional mobility via w/c, strengthening, endurance, and self care retraining. Pt completes supine to sit transfer with SBA and donns socks with CGA seated EOB requiring verbal cues for helpful techniques. Pt completes slide board transfer from bed to w/c with Min A and transports himself to sink using the R UE to complete oral care. Pt transports himself down to rehab apartment and ortho gym focusing on functional mobility/strengthening/endurance with SBA. Pt requires increased time and rest breaks. Pt able to complete turns with good control. Pt propels himself back to his room with SBA via w/c and engages in toilet transfer training and toileting. Pt completes lateral transfer with Min A and performs toileting with Min A to pull pants up over hips. Pt completes lateral transfer back to w/c with Min A. Pt was transported back to his bed via Total A and completes slideboard transfer to the R side with Min A. Pt returns to supine with supervision. BP read 107/63. Pt was left in bed at end of session with ex-wife present in room and all needs met. Verbal cues required to maintain WB status during session.   Therapy  Documentation Precautions:  Precautions Precautions: Fall Restrictions Weight Bearing Restrictions: Yes LUE Weight Bearing: Non weight bearing RLE Weight Bearing: Non weight bearing LLE Weight Bearing: Non weight bearing    Therapy/Group: Individual Therapy  Marvetta Gibbons 01/30/2022, 4:49 PM

## 2022-01-30 NOTE — Progress Notes (Signed)
Occupational Therapy Session Note  Patient Details  Name: Norman Crawford MRN: [id number] Date of Birth: Jan 24, 1966  Today's Date: 01/30/2022 OT Individual Time: 1700-1749 OT Individual Time Calculation (min): 60 min    Short Term Goals: Week 1:  OT Short Term Goal 1 (Week 1): Pt will increase bed mobility to Supervision while maintaining WB restrictions without requiring VC to follow. OT Short Term Goal 2 (Week 1): Patient will complete functional transfer; using preferred technique; with Min A. OT Short Term Goal 3 (Week 1): Pt will increase LB bathing by completing task while seated in shower with Min A.  Skilled Therapeutic Interventions/Progress Updates:    Patient received seated in wheelchair - reports discomfort from sitting x 30 min.  Patient eager to shower.  Used roll in shower chair - transferred into chair via posterior transition.  Patient maintained BP sufficient to be upright for shower, see vitals.  Patient tolerated firm seat of chair.  Patient issued new wheelchair cushion to hopefully ease discomfort when seated.  Patient able to slide out of shorts in trasnfer to shower chair, and dress lower body from bed level.  Patient exhausted after ADL - stating - "I feel like I just ran a marathon!" Patient left up in bed with bed alarm OFF - noted on safety plan - as patient needs trapeze for bed mobility, position changes, and alarm frequently disruptive.  Patient showing no unsafe behaviors this session.  Personal items left within reach as directed by patient.    Therapy Documentation Precautions:  Precautions Precautions: Fall Restrictions Weight Bearing Restrictions: Yes LUE Weight Bearing: Non weight bearing RLE Weight Bearing: Non weight bearing LLE Weight Bearing: Non weight bearing    Vital Signs:  BP Seated:  108/62 BP seated after shower:  118/75 Pain: Pain Assessment Pain Scale: 0-10 Pain Score: 0-No pain     Therapy/Group: Individual Therapy  [name] 01/30/2022, 12:57 PM
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
## 2022-01-31 ENCOUNTER — Inpatient Hospital Stay (HOSPITAL_COMMUNITY): Payer: No Typology Code available for payment source

## 2022-01-31 ENCOUNTER — Encounter (HOSPITAL_COMMUNITY): Payer: Self-pay | Admitting: Physical Medicine and Rehabilitation

## 2022-01-31 DIAGNOSIS — T07XXXA Unspecified multiple injuries, initial encounter: Secondary | ICD-10-CM | POA: Diagnosis not present

## 2022-01-31 DIAGNOSIS — S32409S Unspecified fracture of unspecified acetabulum, sequela: Secondary | ICD-10-CM | POA: Diagnosis not present

## 2022-01-31 LAB — BASIC METABOLIC PANEL
Anion gap: 11 (ref 5–15)
BUN: 34 mg/dL — ABNORMAL HIGH (ref 6–20)
CO2: 21 mmol/L — ABNORMAL LOW (ref 22–32)
Calcium: 9.1 mg/dL (ref 8.9–10.3)
Chloride: 102 mmol/L (ref 98–111)
Creatinine, Ser: 1.4 mg/dL — ABNORMAL HIGH (ref 0.61–1.24)
GFR, Estimated: 59 mL/min — ABNORMAL LOW (ref >60)
Glucose, Bld: 131 mg/dL — ABNORMAL HIGH (ref 70–99)
Potassium: 3.3 mmol/L — ABNORMAL LOW (ref 3.5–5.1)
Sodium: 134 mmol/L — ABNORMAL LOW (ref 135–145)

## 2022-01-31 MED ORDER — POTASSIUM CHLORIDE CRYS ER 20 MEQ PO TBCR
40.0000 meq | EXTENDED_RELEASE_TABLET | Freq: Once | ORAL | Status: AC
Start: 2022-01-31 — End: 2022-01-31
  Administered 2022-01-31: 40 meq via ORAL
  Filled 2022-01-31: qty 2

## 2022-01-31 MED ORDER — HYDROCHLOROTHIAZIDE 12.5 MG PO TABS
6.2500 mg | ORAL_TABLET | Freq: Every day | ORAL | Status: DC
  Administered 2022-02-01: 6.25 mg via ORAL
  Filled 2022-01-31: qty 1

## 2022-01-31 MED ORDER — LOSARTAN POTASSIUM 50 MG PO TABS
50.0000 mg | ORAL_TABLET | Freq: Every day | ORAL | Status: DC
  Administered 2022-02-01: 50 mg via ORAL
  Filled 2022-01-31: qty 1

## 2022-01-31 MED ORDER — POTASSIUM CHLORIDE 20 MEQ PO PACK
40.0000 meq | PACK | Freq: Once | ORAL | Status: DC
  Filled 2022-01-31: qty 2
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
## 2022-01-31 NOTE — Progress Notes (Signed)
Occupational Therapy Session Note  Patient Details  Name: Norman Crawford MRN: [id number] Date of Birth: 01-13-1966  Today's Date: 01/31/2022 OT Individual Time: 1105-1210 OT Individual Time Calculation (min): 65 min    Short Term Goals: Week 1:  OT Short Term Goal 1 (Week 1): Pt will increase bed mobility to Supervision while maintaining WB restrictions without requiring VC to follow. OT Short Term Goal 2 (Week 1): Patient will complete functional transfer; using preferred technique; with Min A. OT Short Term Goal 3 (Week 1): Pt will increase LB bathing by completing task while seated in shower with Min A.  Skilled Therapeutic Interventions/Progress Updates:    Patient received supine in bed.  Transferred to wheelchair toward right without sliding board. Patient transported self to gym.  Transferred slightly uphill without slide board.  This was slightly more challenging and patient needed cueing to maintain NWB BLE.  Patient with consistent report of discomfort in right hip when weight placed on hip.  Patient holding breath at times during functional transfers - and reports mild "swimmy headedness"   Unwrapped left hand and completed edema massage.  Educated patient on pumping mechanism in hand to reduce swelling, elevation of hand above heart.  Completed shoulder exercises to prevent stiffness LUE.   Patient with slightly decreased BP at end of session following transfer.  Assisted back to room and back to bed for rest before PT session.  Patient pleased with progress, and reports some anxiousness about being a burden to his family.  Reviewed that focus of rehab was learning to manage for several weeks with current precautions.  Patient demo's understanding.  Left in bed with personal items and call bell within reach.     Therapy Documentation Precautions:  Precautions Precautions: Fall Restrictions Weight Bearing Restrictions: Yes LUE Weight Bearing: Non weight bearing RLE Weight  Bearing: Non weight bearing LLE Weight Bearing: Non weight bearing   Vital Signs:  97/69 seated after transfer back to chair.   Pain:  Patient reports sharp pain in right hip during transfers.  Pain short lived - overall pain 2 hips/pelvis - not taking narcotics per patient report - just muscle relaxants.      Therapy/Group: Individual Therapy  [name] 01/31/2022, 2:05 PM

## 2022-01-31 NOTE — Progress Notes (Signed)
Occupational Therapy Session Note  Patient Details  Name: Norman Crawford MRN: [id number] Date of Birth: [birth year]  Today's Date: 01/31/2022 OT Individual Time: 9242-6834 OT Individual Time Calculation (min): 72 min    Short Term Goals: Week 1:  OT Short Term Goal 1 (Week 1): Pt will increase bed mobility to Supervision while maintaining WB restrictions without requiring VC to follow. OT Short Term Goal 2 (Week 1): Patient will complete functional transfer; using preferred technique; with Min A. OT Short Term Goal 3 (Week 1): Pt will increase LB bathing by completing task while seated in shower with Min A.  Skilled Therapeutic Interventions/Progress Updates:    Patient received supine in bed - agreeable to shower in actual tub/shower as he would at home.  Used padded cut out tub bench for comfort.  Seated BP 112/68 prior to shower.  Patient able to transfer - level surface without slide board to tub transfer bench after explanation.  Patient needed min assist to help move each leg into tub.  Patient able to wash all body parts minus LUE covered to protect cast material.  Reviewed benefit of tub transfer bench, grab bars, curtain management, and hand held shower head.  Patient able to transfer out of shower and even get his legs out. Patient experiencing discomfort in R hip when accepting weight on this side.  Pain is short lived but sharp.  Patient indicates not using prescription pain medication - only muscle relaxants.  Patient indicates pain does not feel muscular.  Pain right hip posterior aspect.   Patient completed dressing from the chair, needing assistance to pull pants over hips.  Lateral lean alone not sufficient.   Patient transported self back to his room, and assisted back to bed for gap between therapy sessions.  Call bell and personal items in reach.    Therapy Documentation Precautions:  Precautions Precautions: Fall Restrictions Weight Bearing Restrictions: Yes LUE Weight  Bearing: Non weight bearing RLE Weight Bearing: Non weight bearing LLE Weight Bearing: Non weight bearing   Pain: Pain Assessment Pain Scale: 0-10 Pain Score: 2-3 right hip during transitions      Therapy/Group: Individual Therapy  [name] 01/31/2022, 1:05 PM

## 2022-01-31 NOTE — Progress Notes (Signed)
Physical Therapy Session Note  Patient Details  Name: Norman Crawford MRN: [id number] Date of Birth: 12-24-1965  Today's Date: 01/31/2022 PT Individual Time: 1300-1415 PT Individual Time Calculation (min): 75 min   Short Term Goals: Week 1:  PT Short Term Goal 1 (Week 1): Pt will perform bed mobiltiy with min assist PT Short Term Goal 2 (Week 1): Pt will trasnfer to and from [facility] with mod assist consisently PT Short Term Goal 3 (Week 1): Pt will propell WC with min assist >165f  Skilled Therapeutic Interventions/Progress Updates:      Pt supine in bed to start. He reports having a "tough day" due to R hip pain. Reports 1/10 pain while resting but this increases with mobility. Rest breaks and repositioning provided for pain management. Pt using hospital bed features to complete supine to sitting EOB with minA for trunk support. Pt reporting some mild lightheadedness that resolved with time. SB transfer to his L side with assist for board placement and stabilizing wheelchair, pt compliant with NWB restrictions in BLE during transfer. BP reporting feeling "faint" and "dizzy. BP reading 94/66. Donned thigh high compression socks and BP retaken reading 93/62.   Pt propelled himself with supervision in 1-wheel arm drive wheelchair from his room to main rehab gym, ~1254f- able to manage turns and parking without assist.   Lateral scoot transfer with CGA and no board (pt requesting to trial) from w/c to mat table, towards his L side - assist for wheelchair management and BLE management for sitting to supine on mat table.   Seated and supine there-ex as follows: -LAQ 2x20 with yellow TB resistance -SAQ 2x20 with 5lb ankle weights -knee isometric extension 2x1 minute with 5# ankle weights -hamstring stretching bilaterally to comfort with more limitation on L > R -2x20 sidelying clamshells on L -2x20 sidelying clamshells on R -1x10 sidelying knee to chest on L -1x10 sidelying knee to chest on  R  Lateral scoot transfer with minA and no board from w/c to mat table, due to fatigue pt putting minor weight through BLE during transfer - educated on benefits of SB when fatigued to prevent weight bearing and improve safety which he voiced understanding. [place] and energy [facility]. SB transfer with minA from w/c to EOB and able to complete bed mobility without assist. Remained in bed with all needs met, bed alarm on, pt made comfortable.    Therapy Documentation Precautions:  Precautions Precautions: Fall Restrictions Weight Bearing Restrictions: Yes LUE Weight Bearing: Non weight bearing RLE Weight Bearing: Non weight bearing LLE Weight Bearing: Non weight bearing General:     Therapy/Group: Individual Therapy  ChAlger Simons/15/2023, 7:37 AM

## 2022-02-01 DIAGNOSIS — S32409S Unspecified fracture of unspecified acetabulum, sequela: Secondary | ICD-10-CM | POA: Diagnosis not present

## 2022-02-01 DIAGNOSIS — T07XXXA Unspecified multiple injuries, initial encounter: Secondary | ICD-10-CM | POA: Diagnosis not present

## 2022-02-01 MED ORDER — LOSARTAN POTASSIUM 50 MG PO TABS
25.0000 mg | ORAL_TABLET | Freq: Every day | ORAL | Status: DC
  Filled 2022-02-01: qty 1
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
## 2022-02-01 NOTE — Discharge Instructions (Addendum)
Inpatient Rehab Discharge Instructions  Norman Crawford Discharge date and time:  02/07/2022  Activities/Precautions/ Functional Status: Activity: no lifting, driving, or strenuous exercise until cleared by MD Diet: regular diet Wound Care: keep wound clean and dry Functional status:  ___ No restrictions     ___ Walk up steps independently ___ 24/7 supervision/assistance   ___ Walk up steps with assistance __x_ Intermittent supervision/assistance  __x_ Bathe/dress independently ___ Walk with walker     ___ Bathe/dress with assistance ___ Walk Independently    ___ Shower independently ___ Walk with assistance    __x_ Shower with assistance __x_ No alcohol     ___ Return to work/school ________  Special Instructions: No driving, alcohol consumption or tobacco use.   Non-weight bearing both lower extremities, unrestricted ROM, bed to chair transfer for total of 6 weeks Non-weight bearing through wrist, may weight bear through elbow. Avoid pronation   COMMUNITY REFERRALS UPON DISCHARGE:     WILL NEED FOLLOW UP ONCE CAN WEIGHT BEAR AND WILL NEED OUTPATIENT REHAB    Medical Equipment/Items Ordered:WHEELCHAIR AND SLIDING BOARD PATIENT HAS ALL OTHER PIECES OF EQUIPMENT FROM OTHER FAMILY MEMBERS                                                 Agency/Supplier: [employer]   [phone]   My questions have been answered and I understand these instructions. I will adhere to these goals and the provided educational materials after my discharge from the hospital.  Patient/Caregiver Signature _______________________________ Date __________  Clinician Signature _______________________________________ Date __________  Please bring this form and your medication list with you to all your follow-up doctor's appointments.

## 2022-02-01 NOTE — Progress Notes (Signed)
Patient ID: SURAJ RAMDASS, male   DOB: December 02, 1965, 56 y.o.   MRN: [id number]  Met with pt and wife to update them regarding team conference goals of supervision-min wheelchair level and target discharge date of 8/23. Wife reports they have a hospital bed, hoyer, power chair and will get the padded tub bench with cut-out and wide drop-arm bedside commode. Worker will get wheelchair and transfer board. Pt feels good about the discharge date and feels will be ready. FMLA papers faxed to wife's employer and letter for pt and his STD forms faxed to his employer and given back to them. Follow up will be needed once can weight bear and will need to save his OP visits. Will continue to work on discharge needs.

## 2022-02-01 NOTE — Progress Notes (Signed)
Physical Therapy Session Note  Patient Details  Name: Norman Crawford MRN: [id number] Date of Birth: Jun 29, 1965  Today's Date: 02/01/2022 PT Individual Time: 1000-1100 PT Individual Time Calculation (min): 60 min   Short Term Goals: Week 1:  PT Short Term Goal 1 (Week 1): Pt will perform bed mobiltiy with min assist PT Short Term Goal 2 (Week 1): Pt will trasnfer to and from [facility] with mod assist consisently PT Short Term Goal 3 (Week 1): Pt will propell WC with min assist >130ft  Skilled Therapeutic Interventions/Progress Updates:  Patient greeted supine in bed and agreeable to PT treatment session, however reporting shooting R hip pain with movement- At rest, pain 1/10 however with movement shoots up to a 6/10. Patient reported he is up to date on his pain medication at this time.    Patient attempted to transition from supine to sitting EOB, however unable to completely right trunk and required mod assist to transition toward EOB. Patient required rest breaks throughout transition secondary to pain with verbal cues for pursed lip breathing.   Patient completed a slideboard transfer from EOB to wheelchair with CGA for safety and therapist placing and stabilizing SB. Patient able to verbalize steps required to properly set-up transfer.   Patient independently propelled wc to/from his room and rehab gym (>150') with one-arm drive function.  Patient completed simulated car transfer in ortho gym via slideboard with CGA throughout and total assist for SB placement and stabilizing the board. Verbal cues for scooting bottom all the way into the car prior to placing L LE into the car. Patient unable to place R LE into the car at this time secondary to pain- Educated patient that if he continues to have R hip pain he could transfer into the back seat and scoot posteriorly. Discussed with patient about having spouse come in to practice car transfer with their personal car- Patient agreeable.   Transfer  to/from wheelchair and EOB in ortho gym via slideboard with CGA for safety and therapist stabilizing SB- Patient continues to require total assist for placing and removing SB.   Seated therex secondary to R hip pain- L hip flexion AROM, 3 x 15 B LAQ AROM, 2 x 20 B hip adduction with soccer ball between LE, 3 x 15 B hip abduction with therapist resisting, x10  Patient reporting increased pain in bottom while sitting EOM and requesting to lie down- Attempted transitioning posteriorly onto B elbows on a wedge, however this continued to cause too much pain. Therapists provided total assist for managing trunk and B LE (one therapist managing trunk and other therapist managing B LE) to transition patient into supine with wedge placed under B LE for support. Patient rested ~5 minutes secondary to pain.   Patient then transitioned from supine to sitting EOM with moderate assistance for trunk management with verbal cues throughout for pursed lip breathing.   Patient returned to his room sitting upright in wheelchair with spouse present- Patient requesting to transition back to bed with NT notified. Safety plan was updated in patient's room.    Therapy Documentation Precautions:  Precautions Precautions: Fall Restrictions Weight Bearing Restrictions: Yes LUE Weight Bearing: Non weight bearing RLE Weight Bearing: Non weight bearing LLE Weight Bearing: Non weight bearing    Therapy/Group: Individual Therapy  Nerine Pulse 02/01/2022, 8:29 AM
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
## 2022-02-01 NOTE — Progress Notes (Signed)
Occupational Therapy Session Note  Patient Details  Name: Norman Crawford MRN: [id number] Date of Birth: 25-Apr-1966  Today's Date: 02/01/2022 OT Individual Time: 5364-6803 OT Individual Time Calculation (min): 60 min    Short Term Goals: Week 1:  OT Short Term Goal 1 (Week 1): Pt will increase bed mobility to Supervision while maintaining WB restrictions without requiring VC to follow. OT Short Term Goal 2 (Week 1): Patient will complete functional transfer; using preferred technique; with Min A. OT Short Term Goal 3 (Week 1): Pt will increase LB bathing by completing task while seated in shower with Min A.  Skilled Therapeutic Interventions/Progress Updates:    Patient received supine in bed - reports having a bad night with significant pain.  "Finally had to take pain medicine" Patient reports fear of becoming addicted to medication to family history of addiction.  Patient used sock aide to don right sock - limited hip mobility to reach foot.  Patient indicates "I am going to slow it down today."  Patient transferred from bed to chair toward right with min assist to place board.  Patient's feet rested on the ground at one point - but he indicated he was not bearing weight.  Patient transported self to sink and completed grooming.  Reviewed his bathroom set up and simulated in tub room to address shower transfers at home.  Patient has very small bathroom - so worked on transferring with slide board to / from tub and working through Data processing manager.   Reviewed options for return to work.  Discussed considering a ramp up schedule that would allow him to work a few hours at a time versus attempting to go back to full time work.   Patient drove wheelchair back to room, and asked to get back to bed to use urinal.  Patient left in bed for next session.    Therapy Documentation Precautions:  Precautions Precautions: Fall Restrictions Weight Bearing Restrictions: Yes LUE Weight Bearing: Non weight  bearing RLE Weight Bearing: Non weight bearing LLE Weight Bearing: Non weight bearing  Pain:  1-2/10 right hip, posterior pelvis    Therapy/Group: Individual Therapy  [name] 02/01/2022, 12:33 PM

## 2022-02-01 NOTE — Progress Notes (Signed)
Patient ID: Norman Crawford, male   DOB: 1966/02/07, 56 y.o.   MRN: [id number]  Gave pt letter regarding hospitalization for work and wife's FMLA papers she needs to sign before can be faxed in.
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
## 2022-02-02 DIAGNOSIS — S32409S Unspecified fracture of unspecified acetabulum, sequela: Secondary | ICD-10-CM | POA: Diagnosis not present

## 2022-02-02 DIAGNOSIS — T07XXXA Unspecified multiple injuries, initial encounter: Secondary | ICD-10-CM | POA: Diagnosis not present

## 2022-02-02 NOTE — Plan of Care (Signed)
Downgraded from supervision to [facility] due to onset of R hip pain with mobility.   Problem: RH Bed to Chair Transfers Goal: LTG Patient will perform bed/chair transfers w/assist (PT) Description: LTG: Patient will perform bed to chair transfers with assistance (PT). Flowsheets (Taken 02/02/2022 1153) LTG: Pt will perform Bed to Chair Transfers with assistance level: Moderate Assistance - Patient 50 - 74%

## 2022-02-02 NOTE — Progress Notes (Signed)
Physical Therapy Weekly Progress Note  Patient Details  Name: Norman Crawford MRN: [id number] Date of Birth: 1965/10/12  Beginning of progress report period: January 26, 2022 End of progress report period: February 02, 2022  Today's Date: 02/02/2022 PT Individual Time: 0730-0826 + [id number] PT Individual Time Calculation (min): 56 min  + 57 min  Patient has met 3 of 3 short term goals.  Pt is making appropriate progress towards LTG of supervision/minA. Overall, he relies on hospital bed features and the trapeze bar for bed mobility with supervision assist (sometimes minA depending on pain), supervision for unsupported sitting balance, CGA/minA for SB transfers in both directions, and is supervision for w/c mobility using the 1-arm drive wheelchair. Pt will not be getting the 1-arm drive wheelchair at discharge, have been cleared by ortho for foot propelling as long as this is not painful. Patient's home is now wheelchair accessible with ramped entrance and widened doorways. He has had some episodes of hypotension which is being managed with thigh-high compression socks and mobility. Will focus therapy towards transitioning home, DC planning, and family education/training. Limited ability to progress mobilizations due to weight bearing restrictions (NWB BLE, WB L forearm only)  Patient continues to demonstrate the following deficits muscle weakness and decreased balance strategies and therefore will continue to benefit from skilled PT intervention to increase functional independence with mobility.  Patient progressing toward long term goals..  Continue plan of care.  PT Short Term Goals Week 1:  PT Short Term Goal 1 (Week 1): Pt will perform bed mobiltiy with min assist PT Short Term Goal 1 - Progress (Week 1): Met PT Short Term Goal 2 (Week 1): Pt will trasnfer to and from [facility] with mod assist consisently PT Short Term Goal 2 - Progress (Week 1): Met PT Short Term Goal 3 (Week 1): Pt will propell WC  with min assist >170f PT Short Term Goal 3 - Progress (Week 1): Met Week 2:  PT Short Term Goal 1 (Week 2): STG=LTG  Skilled Therapeutic Interventions/Progress Updates:      1st session: Pt presenting in bed to start, agreeable to therapy session. Denies resting pain but reports primarily R hip pain with mobility. Repositioning and rest breaks provided as needed for pain management. Pt reporting increased difficulty with R hip flexion while attempting to do SLR in bed, reports he was able to do this a few days ago but is having increased difficulty today. Will continue to monitor.   BP taken in bed 109/72 and then retaken once in wheelchair: 77/58. Pt reporting feeling only minimally "swimmy headeded" and requests to avoid compression socks for now.   Supine<>sitting EOB with light minA for trunk support, relying on hospital bed features and trapeze bar overhead. Completed SB transfer with CGA from EOB to w/c, towards his L side, assist needed for board placement and w/c setup.  Propelled himself in room mod I in 1-arm wheel drive to sink for him to complete a few self care tasks (brushing teeth, combing hair) without assist. BP re-taken after this: 98/77. Pt asymptomatic.   Propelled himself from his room to main rehab gym, ~1015f with distant supervision using 1-arm wheel drive on R. Assisted with SB transfer with CGA from w/c to mat table, assist for stabilizing wheelchair for safety. Required assist for transitioning to supine position for sitting, primarily for BLE management. Pt with sharp pains in R hip during this transition, pain resolving with rest in supine position.  Completed the following there-ex: -  3x60 second SAQ with 5# ankle weights bilaterally (no pain) -3x30 second knee isometric with 5# ankle weights bilaterally with adductor squeeze through ball (no pain) -2x30 second hip abductor isometrics bilaterally against ball (no pain)  Supine<>sitting edge of mat requiring modA  for trunk support due to exacerbation of R hip pain, described as sharp and shooting in R posterior aspect of hip. SB transfer with minA due to pain from mat table to w/c, towards his L side. Returned to room with [place] for time management. Pt reporting need to have BM. LPN in room and made aware of needs.   2nd session: Pt sleeping in bed on arrival - awakens easily to voice and is agreeable to PT tx. Denies resting pain but continues to have the sharp shooting pains in R hip during mobility. Rest breaks and repositioning provided as needed. Supine<>Sitting EOB with hospital bed features and overhead trapeze bar, supervision until needing minA for trunk support to upright due to R hip pain. Completed SB transfer with CGA with assist for w/c setup and board placement. Improved pain tolerance compared to earlier session when patient paces transfer.  Propels himself with distant supervision in 1-wheel arm drive (R) chair from his room to ortho rehab gym, >195f. Able to manage doorways without assist as well. Discussed that he will not have 1-wheel arm drive chair at DC and will likely be reliant on others for w/c mobility - pt voiced understanding.   Setup using UE ergometer at L7 resistance - using RUE only, completed 5 min forwards + 5 min backwards with rest break b/w sets.   Seated there-ex with 9lb dowel rod (5lb + 4lb ankle weight), RUE only -2x20 shldr press -2x20 bicep curls -2x10 shoulder abduction using 5lb dowel rod -2x10 shoulder flexion using 5lb dowel rod -3x10 overhead tricep extensions using 6lb dumbbell  Returned to room in w/c and assisted to bed with SB minA transfer, towards his L with hospital bed features. Required modA for returning to supine position due to R hip pain, assist primarily for BLE management only. Stretching in bed for hamstring release, R & L with active ankle DF to increase stretch. Completed to tolerance with patient only reporting hamstring tightness, no  similar R hip pains.   Concluded session in bed with all needs met, pt made comfortable, call bell in reach.   Therapy Documentation Precautions:  Precautions Precautions: Fall Restrictions Weight Bearing Restrictions: Yes LUE Weight Bearing: Non weight bearing RLE Weight Bearing: Non weight bearing LLE Weight Bearing: Non weight bearing General:    Therapy/Group: Individual Therapy  Lelania Bia P Shaquera Ansley 02/02/2022, 7:04 AM
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
## 2022-02-02 NOTE — Progress Notes (Addendum)
Occupational Therapy Session Note  Patient Details  Name: Norman Crawford MRN: [id number] Date of Birth: 09/14/1965  Today's Date: 02/02/2022 OT Individual Time: 1300-1415 OT Individual Time Calculation (min): 75 min    Short Term Goals: Week 1:  OT Short Term Goal 1 (Week 1): Pt will increase bed mobility to Supervision while maintaining WB restrictions without requiring VC to follow. OT Short Term Goal 2 (Week 1): Patient will complete functional transfer; using preferred technique; with Min A. OT Short Term Goal 3 (Week 1): Pt will increase LB bathing by completing task while seated in shower with Min A.  Skilled Therapeutic Interventions/Progress Updates:  Pt seen for skilled OT session with strong focus on promoting pt's ability to direct functional ADL and mobility routine for care within CIR to increase readiness for home setting. Pt open and agreeable to all aspects of activity and education provided. Pt in bed upon OT arrival and this clinician was referred to pt as pt has mentioned in previous sessions some concerns regarding best approach to a successful dynamic within his relationship with his partner as both participate in alternate lifestyle activity and roles and some concerns re positioning for return to sexual activity and leisure due to NWB status. OT provided various strategies for previous as well as re-framing ideations as well as ways to integrate successful roles and dynamics. Pt was able to generate additional helpful mechanisms for building toward this while in rehab and once home. OT training with breathing and mindfulness to improve overall activity tolerance, pain management and overall well-being. Transfer training to and from bed with optimal board placement while using SB for safest and most independent method with pt reporting ortho MD assessed R pelvis hardware and "one screw may be backing out and I cannot manage my leg as well" thus OT provided min A for LE management  and alignment. Pt performed set up of mouth/oral care sink side seated. Transported to out doors for added exposure to community reintegration with some self propulsion with R UE and OT guiding for directionality~ 4 sets of 65 -75 feet with rests between intervals. Seated push ups with R UE and lateral weight shift training for pressure reliefs due to NWB L UE except elbow. Once back to room, pt re-settled into bed and left with + response and understanding to all instruction and discussion, safety measures and all needs within reach.   Therapy Documentation Precautions:  Precautions Precautions: Fall Restrictions Weight Bearing Restrictions: Yes LUE Weight Bearing: Non weight bearing RLE Weight Bearing: Non weight bearing LLE Weight Bearing: Non weight bearing    Therapy/Group: Individual Therapy  Vicenta Dunning 02/02/2022, 9:41 PM

## 2022-02-03 ENCOUNTER — Inpatient Hospital Stay (HOSPITAL_COMMUNITY): Payer: No Typology Code available for payment source

## 2022-02-03 DIAGNOSIS — T07XXXA Unspecified multiple injuries, initial encounter: Secondary | ICD-10-CM | POA: Diagnosis not present

## 2022-02-03 DIAGNOSIS — S32409S Unspecified fracture of unspecified acetabulum, sequela: Secondary | ICD-10-CM | POA: Diagnosis not present

## 2022-02-03 MED ORDER — ATENOLOL 25 MG PO TABS
25.0000 mg | ORAL_TABLET | Freq: Every morning | ORAL | Status: DC
  Administered 2022-02-03 – 2022-02-07 (×5): 25 mg via ORAL
  Filled 2022-02-03 (×5): qty 1

## 2022-02-03 NOTE — Progress Notes (Signed)
Occupational Therapy Weekly Progress Note  Patient Details  Name: Norman Crawford MRN: [id number] Date of Birth: [birth year]  Beginning of progress report period: January 26, 2022 End of progress report period: February 03, 2022  Today's Date: 02/03/2022 OT Individual Time: 0800-0905 OT Individual Time Calculation (min): 65 min    Patient has met 2 of 3 short term goals.  Patient has experienced a minor set back in that he now has pain in right hip with functional mobility.  This has impacted bed mobility and all transitions toward right side.    Patient continues to demonstrate the following deficits: muscle weakness and decreased cardiorespiratoy endurance and therefore will continue to benefit from skilled OT intervention to enhance overall performance with BADL.  Patient progressing toward long term goals..  Continue plan of care.  OT Short Term Goals Week 1:  OT Short Term Goal 1 (Week 1): Pt will increase bed mobility to Supervision while maintaining WB restrictions without requiring VC to follow. OT Short Term Goal 1 - Progress (Week 1): Partly met OT Short Term Goal 2 (Week 1): Patient will complete functional transfer; using preferred technique; with Min A. OT Short Term Goal 2 - Progress (Week 1): Met OT Short Term Goal 3 (Week 1): Pt will increase LB bathing by completing task while seated in shower with Min A. OT Short Term Goal 3 - Progress (Week 1): Met  Skilled Therapeutic Interventions/Progress Updates:    Patient agreeable to OT session - received supine in bed.  Patient indicating need to void.  Transferred to drop arm wide BSC.  Patient needed assistance to pull pants down over hips.  Reports discomfort with all weight shifting toward right, and having difficulty raising right hip toward flexion to pull pants down.  Patient unable to have BM at this time.  Transferred back to bed for posterior transfer to rolling shower chair.  Patient able to wash all body parts minus right  foot from seated position.  Recommend patient consider a long handled brush or sponge to be more independent.  Patient indicates showers will be communal for awhile indicating wife can help with aspects as needed.  Patient did anterior transfer back into bed to complete dressing.  Able to long sit and don pants over feet, then transitioned to supine, sidelying to pull pants up over hips.  Patient exhausted after shower.   Patient shared information about OT session yesterday in which he could problem solve issues relating to sex - modifications / considerations to account for his injuries/ restrictions.  Patient expressed feeling apprehensive initially, but now feeling better.   Patient limited by Right hip/buttock pain today.    Therapy Documentation Precautions:  Precautions Precautions: Fall Restrictions Weight Bearing Restrictions: Yes LUE Weight Bearing: Non weight bearing RLE Weight Bearing: Non weight bearing LLE Weight Bearing: Non weight bearing General: General OT Amount of Missed Time: 10 Minutes   Pain: Pain Assessment Pain Scale: 0-10 Pain Score: 6  Pain Type: Acute pain Pain Location: Hip Pain Orientation: Right Pain Descriptors / Indicators: Aching Pain Frequency: Occasional Pain Onset: On-going Patients Stated Pain Goal: 1 Pain Intervention(s): RN made aware;Pain med given for lower pain score than stated, per patient request Multiple Pain Sites: No   Therapy/Group: Individual Therapy  Mariah Milling 02/03/2022, 12:00 PM

## 2022-02-03 NOTE — Progress Notes (Signed)
Occupational Therapy Session Note  Patient Details  Name: DALTYN DEGROAT MRN: [id number] Date of Birth: 1966-04-23  Today's Date: 02/03/2022 OT Individual Time: 1345-1415 OT Individual Time Calculation (min): 30 min    Short Term Goals: Week 1:  OT Short Term Goal 1 (Week 1): Pt will increase bed mobility to Supervision while maintaining WB restrictions without requiring VC to follow. OT Short Term Goal 1 - Progress (Week 1): Partly met OT Short Term Goal 2 (Week 1): Patient will complete functional transfer; using preferred technique; with Min A. OT Short Term Goal 2 - Progress (Week 1): Met OT Short Term Goal 3 (Week 1): Pt will increase LB bathing by completing task while seated in shower with Min A. OT Short Term Goal 3 - Progress (Week 1): Met  Skilled Therapeutic Interventions/Progress Updates:    Upon OT arrival, pt reports pain in R hip and shooting pain in right lower back with rolling to R side. Pt requests assistance to pull up pants over hips. Pt requires Mod A to complete Therapist discussed POC and provided therapeutic listening to pt's concerns with progress with this new onset of shooting pain. Pt requesting recommendation for privacy with toileting as pt feels he has a shy bladder and staff remove his privacy tools for example the curtain and therapist educates pt on advocating for himself and continuing to request his curtain be left alone where it keeps him covered and pt verbalizes understanding. Pt requesting alternatives for pain relief aside from pain killers and discussed hot pack and repositioning. Pt also fearful of staying in bed for too long and was introduced to chair feature on bed to promote sitting upright. Pt appreciative and was left in bed with all needs met at end of session.   Therapy Documentation Precautions:  Precautions Precautions: Fall Restrictions Weight Bearing Restrictions: Yes LUE Weight Bearing: Non weight bearing RLE Weight Bearing: Non  weight bearing LLE Weight Bearing: Non weight bearing  Therapy/Group: Individual Therapy  Marvetta Gibbons 02/03/2022, 4:26 PM

## 2022-02-03 NOTE — Progress Notes (Signed)
Physical Therapy Session Note  Patient Details  Name: Norman Crawford MRN: [id number] Date of Birth: 21-Mar-1966  Today's Date: 02/03/2022 PT Individual Time: 0930-1030 PT Individual Time Calculation (min): 60 min   Short Term Goals: Week 1:  PT Short Term Goal 1 (Week 1): Pt will perform bed mobiltiy with min assist PT Short Term Goal 1 - Progress (Week 1): Met PT Short Term Goal 2 (Week 1): Pt will trasnfer to and from [facility] with mod assist consisently PT Short Term Goal 2 - Progress (Week 1): Met PT Short Term Goal 3 (Week 1): Pt will propell WC with min assist >149f PT Short Term Goal 3 - Progress (Week 1): Met  Skilled Therapeutic Interventions/Progress Updates:  Patient greeted supine in bed with reports of 6-7/10 R hip pain with exertion. RN present and administered pain medication prior to start of treatment session.   Patient transitioned from semi-reclined to sitting EOB with ModA for righting trunk, however patient was able to independently manage B LE off the bed. Once sitting EOB, patient was able to verbalize wheelchair set-up for transfer. Patient transferred from EOB to wheelchair via slideboard and SBA for safety- Therapist only present for SB placement, stabilizing board and wheelchair. Patinet propelled one arm drive wheelchair to sink where he brushed his teeth independently.   Patient propelled manual wheelchair from his room to main rehab gym independently.   [facility] with SBA for safety- Therapist placing/removing slideboard, as well as stabilizing wheelchair and board throughout transfer. Patient able to manage arm rests with minor verbal cues.    Once seated EOM, patient transitioned from EOM to supine with minimal assistance for managing R LE secondary to R hip pain.   Supine Therex- B SAQ with 5#, 3 x 12 L knee to chest over bolster AROM, 3 x 8 R knee to chest over bolster AROM, 3 x 5  B hip abduction with small  ball between LE, 3 x 10 B hip abduction against resistance, 3 x 10 Mini-crunches, 3 x 10 R LE stretching (hamstring, calf, adductors, abductors)  Patient required several attempts and physical assistance from therapist, but was unsuccessful transitioning from supine to sitting EOB on the R side. Worked throughout various scenarios, however patient was unable to sit up on his R side due to hip pain. Patient walked around the mat on elbows in order to transition from L sidelying to sitting EOM with moderate assistance for B LE and righting trunk. Patient educated on properly setting up his bed at home in order to facilitate transitioning out of the bed on the L.    Patient returned to his room sitting upright in wheelchair with tray table on his side, call bell within reach and all needs met.    Therapy Documentation Precautions:  Precautions Precautions: Fall Restrictions Weight Bearing Restrictions: Yes LUE Weight Bearing: Non weight bearing RLE Weight Bearing: Non weight bearing LLE Weight Bearing: Non weight bearing   Therapy/Group: Individual Therapy  [name] 02/03/2022, 8:55 AM
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
## 2022-02-03 NOTE — Progress Notes (Signed)
Occupational Therapy Session Note  Patient Details  Name: KADE DEMICCO MRN: [id number] Date of Birth: 07-04-1965  Today's Date: 02/03/2022 OT Individual Time: 1100-1155 OT Individual Time Calculation (min): 55 min    Short Term Goals: Week 2:     Skilled Therapeutic Interventions/Progress Updates:    Patient received seated in wheelchair.  Declined going back to gym for additional work on functional mobility/ bed mobility (just finished PT)  Patient asking to go outside.  Patient drove to elevator, assisted with door management and elevator navigation.  Worked on propelling wheelchair on various grades and surfaces.  Patient having difficulty with leftward sloping surface - needed assistance to navigate with control.  Patient able to negotiate turning, backing up, minor transitions.  Patient assisted back to room 50% due to R arm fatigue.   Patient assisted back to bed - moving toward left.  Patient able to place short slide board under left hip more effectively than long slide board.  Continues to have difficulty with all weight acceptance through right posterior hip.  Patient left supine in bed with all needs addressed and call bell personal items in reach.    Therapy Documentation Precautions:  Precautions Precautions: Fall Restrictions Weight Bearing Restrictions: Yes LUE Weight Bearing: Non weight bearing RLE Weight Bearing: Non weight bearing LLE Weight Bearing: Non weight bearing   Pain: Pain Assessment Pain Scale: 0-10 Pain Score: 3  Pain Type: Acute pain Pain Location: Hip Pain Orientation: Right Pain Descriptors / Indicators: Aching Pain Frequency: Occasional Pain Onset: On-going Patients Stated Pain Goal: 1 Pain Intervention(s): RN made aware;Pain med given for lower pain score than stated, per patient request Multiple Pain Sites: No    Therapy/Group: Individual Therapy  [name] 02/03/2022, 12:54 PM

## 2022-02-04 DIAGNOSIS — R339 Retention of urine, unspecified: Secondary | ICD-10-CM | POA: Diagnosis not present

## 2022-02-04 DIAGNOSIS — I10 Essential (primary) hypertension: Secondary | ICD-10-CM | POA: Diagnosis not present

## 2022-02-04 DIAGNOSIS — K5901 Slow transit constipation: Secondary | ICD-10-CM

## 2022-02-04 DIAGNOSIS — G8918 Other acute postprocedural pain: Secondary | ICD-10-CM

## 2022-02-04 DIAGNOSIS — S32409S Unspecified fracture of unspecified acetabulum, sequela: Secondary | ICD-10-CM | POA: Diagnosis not present

## 2022-02-04 MED ORDER — TAMSULOSIN HCL 0.4 MG PO CAPS
0.8000 mg | ORAL_CAPSULE | Freq: Every day | ORAL | Status: DC
  Administered 2022-02-04 – 2022-02-07 (×4): 0.8 mg via ORAL
  Filled 2022-02-04 (×4): qty 2

## 2022-02-04 MED ORDER — SORBITOL 70 % SOLN
60.0000 mL | Status: AC
  Administered 2022-02-04: 60 mL via ORAL
  Filled 2022-02-04: qty 60
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
## 2022-02-04 NOTE — Progress Notes (Signed)
Occupational Therapy Session Note  Patient Details  Name: Norman Crawford MRN: [id number] Date of Birth: [birth year]  Today's Date: 02/04/2022 Session 1: OT Individual Time: 1015-1100 OT Individual Time Calculation (min): 45 min  Session 2: OT Individual Time: 1400-1445 OT Individual Time Calculation (min): 45 min   Short Term Goals: Week 2:   STG = LTG (due to ELOS)  Skilled Therapeutic Interventions/Progress Updates:  Session 1:   Patient agreeable to participate in OT session. Reports intermittent pain in right hip.   Patient participated in skilled OT session focusing on  toileting, functional transfers, and abdominal strengthening with education provided with HEP handout. Therapist facilitated session with pt providing direction on needed transfer and bed mobility assist, set-up of BSC and slide board placement in order to promote increased independence with directing care to prepare for planned discharge. Pt provided with Min Assist with RLE during bed mobility when moving BLEs to the EOB, therapist placed SB on left side with patient requiring CGA to transfer from bed to BABSC. Toileting completed with Min A (assist to pull pants over hips on the left side only). Pt was able to safely transfer back to bed using same technique with SB. SB stabilized by OT during transfer with DABSC. Min A provided to BLE when transitioning from seated to side lying due to right hip pain.  While supine, OT educated patient on core strengthening exercises safe to perform while maintaining WB status in BLE. Handout provided for reference.  Exercises completed: Left leg -straight leg raise; 10X. Unilateral knee raise with knee bent; 10X. Bilateral knees bent fall out (Hip IR/er)   Session 2: Patient agreeable to participate in OT session. Reports intermittent pain in  right hip with movement and mobility.   Patient participated in skilled OT session focusing on toileting, functional transfers, ADL  re-training, and wheelchair management. Pt reports continued difficulty to have a BM although was hopeful to have one during session and requested to transfer to [facility] with OT. Pt provided with Min Assist with RLE during bed mobility when moving BLEs to the EOB, therapist placed SB on left side with patient requiring CGA to transfer from bed to BABSC. Toileting completed with Min A (assist to pull pants over hips on the left side only). Pt was able to safely transfer back to bed using same technique with SB. SB stabilized by OT during transfer with DABSC. Pt requested to t/f back to bed then transfer to w/c using SB. Same level of assist was provided. At sink level while seated in w/c, pt completed grooming task at Mod I level. Pt then participated in UB strengthening activity of navigating w/c through rehab unit to 4 North walkway with SBA. Due to RUE fatigue, patient requested assist to push w/c back to room where pt transferred from w/c to bed with use of SB and CGA.      Therapy Documentation Precautions:  Precautions Precautions: Fall Restrictions Weight Bearing Restrictions: Yes LUE Weight Bearing: Non weight bearing RLE Weight Bearing: Non weight bearing LLE Weight Bearing: Non weight bearing   Therapy/Group: Individual Therapy  Limmie Patricia, OTR/L,CBIS  Supplemental OT - [facility] and [facility]  02/04/2022, 10:29 AM

## 2022-02-05 DIAGNOSIS — I10 Essential (primary) hypertension: Secondary | ICD-10-CM | POA: Diagnosis not present

## 2022-02-05 DIAGNOSIS — S32409S Unspecified fracture of unspecified acetabulum, sequela: Secondary | ICD-10-CM | POA: Diagnosis not present

## 2022-02-05 DIAGNOSIS — R339 Retention of urine, unspecified: Secondary | ICD-10-CM | POA: Diagnosis not present

## 2022-02-05 DIAGNOSIS — K5901 Slow transit constipation: Secondary | ICD-10-CM | POA: Diagnosis not present

## 2022-02-05 MED ORDER — METHOCARBAMOL 500 MG PO TABS
1000.0000 mg | ORAL_TABLET | Freq: Four times a day (QID) | ORAL | Status: DC
  Administered 2022-02-05 – 2022-02-08 (×11): 1000 mg via ORAL
  Filled 2022-02-05 (×11): qty 2

## 2022-02-05 MED ORDER — SENNOSIDES-DOCUSATE SODIUM 8.6-50 MG PO TABS
2.0000 | ORAL_TABLET | Freq: Every day | ORAL | Status: DC
  Administered 2022-02-05 – 2022-02-06 (×2): 2 via ORAL
  Filled 2022-02-05 (×3): qty 2

## 2022-02-05 NOTE — Progress Notes (Signed)
Physical Therapy Session Note  Patient Details  Name: Norman Crawford MRN: [id number] Date of Birth: [birth year]  Today's Date: 02/05/2022 PT Individual Time: 1445-1530 PT Individual Time Calculation (min): 45 min   Short Term Goals: Week 2:  PT Short Term Goal 1 (Week 2): STG=LTG  Skilled Therapeutic Interventions/Progress Updates: Pt presents supine in bed and agreeable/anxious to participate in therapy.  Pt rolls to Left using rail, and then able to push up onto l elbow, but then requires mod A w/ R hand to complete transfer.  Pt uunable to transfer to right 2/2 increased pain R LE/pelvis.  Pt able to position self/lean to right for SB, but unable to position w/ LUE.  Pt transfers w/ SB and supervision bed > w/c but PT cautioned about WB through LE s.  Pt the wheeled slef in 1-arm drive w/c w/ supervision to dayroom.  Pt performed SB to mat table to left w/ supervision and assist w/ SB positioning.  Pt requires PT assist for BLE maintaining close proximity and lowers w/ L elbow.  Pt performed HS, supine B clamshells, SAQ, GS 3 x 10-15.  Pt required min A for rolling to L, mod A for sidelying to sit w/ pt maintaining LE s in hooklying position.  Pt transferred mat table >w/c w/ supervision.  Discussed methods of improving transfer using gait belt on footboard (pt w/ hospital bed at home) and will follow-up w/ primary PT.  Pt wished to remain sitting in w/c for company.  All needs in reach.     Therapy Documentation Precautions:  Precautions Precautions: Fall Restrictions Weight Bearing Restrictions: Yes LUE Weight Bearing: Non weight bearing RLE Weight Bearing: Non weight bearing LLE Weight Bearing: Non weight bearing General:   Vital Signs: Therapy Vitals Temp: 98.4 F (36.9 C) Pulse Rate: 76 Resp: 18 BP: 113/75 Patient Position (if appropriate): Lying Oxygen Therapy SpO2: 97 % O2 Device: Room Air Pain: 0/10 initially, increased to 6/10 w/ transfer.       Therapy/Group:  Individual Therapy  [name] 02/05/2022, 3:41 PM
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
## 2022-02-06 DIAGNOSIS — N179 Acute kidney failure, unspecified: Secondary | ICD-10-CM | POA: Diagnosis not present

## 2022-02-06 DIAGNOSIS — I10 Essential (primary) hypertension: Secondary | ICD-10-CM | POA: Diagnosis not present

## 2022-02-06 DIAGNOSIS — D72829 Elevated white blood cell count, unspecified: Secondary | ICD-10-CM | POA: Diagnosis not present

## 2022-02-06 DIAGNOSIS — S32409S Unspecified fracture of unspecified acetabulum, sequela: Secondary | ICD-10-CM | POA: Diagnosis not present

## 2022-02-06 DIAGNOSIS — E876 Hypokalemia: Secondary | ICD-10-CM

## 2022-02-06 LAB — BASIC METABOLIC PANEL
Anion gap: 8 (ref 5–15)
BUN: 21 mg/dL — ABNORMAL HIGH (ref 6–20)
CO2: 25 mmol/L (ref 22–32)
Calcium: 9.2 mg/dL (ref 8.9–10.3)
Chloride: 104 mmol/L (ref 98–111)
Creatinine, Ser: 1.22 mg/dL (ref 0.61–1.24)
GFR, Estimated: >60 mL/min (ref >60)
Glucose, Bld: 103 mg/dL — ABNORMAL HIGH (ref 70–99)
Potassium: 3.6 mmol/L (ref 3.5–5.1)
Sodium: 137 mmol/L (ref 135–145)

## 2022-02-06 LAB — CBC
HCT: 34.3 % — ABNORMAL LOW (ref 39.0–52.0)
Hemoglobin: 11.2 g/dL — ABNORMAL LOW (ref 13.0–17.0)
MCH: 30.9 pg (ref 26.0–34.0)
MCHC: 32.7 g/dL (ref 30.0–36.0)
MCV: 94.8 fL (ref 80.0–100.0)
Platelets: 461 10*3/uL — ABNORMAL HIGH (ref 150–400)
RBC: 3.62 MIL/uL — ABNORMAL LOW (ref 4.22–5.81)
RDW: 14.7 % (ref 11.5–15.5)
WBC: 5.6 10*3/uL (ref 4.0–10.5)
nRBC: 0 % (ref 0.0–0.2)

## 2022-02-06 NOTE — Progress Notes (Signed)
Occupational Therapy Session Note  Patient Details  Name: Norman Crawford MRN: [id number] Date of Birth: 1965/10/30  Today's Date: 02/06/2022 OT Individual Time: 0805-0905 OT Individual Time Calculation (min): 60 min    Short Term Goals: Week 2:   STG= LTG  Skilled Therapeutic Interventions/Progress Updates:    Patient received supine in bed - eager to show equipment set up at home - hospital bed, grab bars in bathroom, and scooter.  Patient transferred into scooter - fairly firm seat.  Patient drove scooter down to tub room to simulate his bathroom.  Transferred to transfer bench with min assist, showered with set up assist.  Able to bring legs out of tub without assistance!  Patient needed to dress in chair and unable to pull pants up over hips effectively from chair - has insufficient lateral lean.  Recommend patient transfer back to bed to complete dressing at home.  Patient left up in scooter for PT session immediately following.  Call bell and personal items in reach.    Therapy Documentation Precautions:  Precautions Precautions: Fall Restrictions Weight Bearing Restrictions: Yes LUE Weight Bearing: Non weight bearing RLE Weight Bearing: Non weight bearing LLE Weight Bearing: Non weight bearing  Pain:  Consistent report of pain right hip with movement / pressure  Therapy/Group: Individual Therapy  [name] 02/06/2022, 1:39 PM

## 2022-02-06 NOTE — Progress Notes (Signed)
Physical Therapy Session Note  Patient Details  Name: Norman Crawford MRN: [id number] Date of Birth: 1965-10-23  Today's Date: 02/06/2022 PT Individual Time: 1045-1200 PT Individual Time Calculation (min): 75 min   Short Term Goals: Week 1:  PT Short Term Goal 1 (Week 1): Pt will perform bed mobiltiy with min assist PT Short Term Goal 1 - Progress (Week 1): Met PT Short Term Goal 2 (Week 1): Pt will trasnfer to and from [facility] with mod assist consisently PT Short Term Goal 2 - Progress (Week 1): Met PT Short Term Goal 3 (Week 1): Pt will propell WC with min assist >151f PT Short Term Goal 3 - Progress (Week 1): Met Week 2:  PT Short Term Goal 1 (Week 2): STG=LTG  Skilled Therapeutic Interventions/Progress Updates:  Patient greeted supine in the bed and agreeable to PT treatment session. Patient reporting R hip pain at rest ~2/10 and spikes to 6/10 "when it catches."   Patient transitioned from supine to sitting EOB with SBA for safety- Patient was able to transition B LE off the bed and then used gait belt attached to the bottom of the bed (to simulate a bed ladder) to right his trunk.   Patient transferred from sitting EOB to wheelchair via slideboard with SBA for safety- Therapist placing/removing slideboard on L due to NWB restrictions.  Patient propelled manual wheelchair via one-arm drive from room to ortho gym independently on smooth indoor surface.   Patient performed transfer to/from wheelchair and car simulator via slideboard and CGA for safety- Therapist providing total assistance for placing and removing slideboard. VC for increased anterior weight shift and proper head/hips relationship in order to improve independence with car transfer. Car simulator seat height at 24" in order to simulate transfer into personal car, causing transfer into car being uphill- VC for not WB through B LE throughout transfer. Patient reporting he will use a power chair when he dc's from the hospital-  Therapist to check with primary PT as this would change the slope of the transfer.   Patient transferred to/from wheelchair and rehab mat via slideboard and SBA for safety- therapist stabilizing SB throughout.   Patient transitioned from sitting EOM to supine to the L with moderate assistance for managing B LE.   Supine Therex- Mini-Crunches, 4 x 10 Chest press with 10# in R UE, AROM L UE, 3 x 12 Skull crushers (overhead triceps) with 10# R UE, 3 x 12 Lateral Flys with 10# in R UE, 3 x 12 Knee to chest stretching with 30 second hold B, x10 Glute squeezes with ball between B LE with 3 second hold, 2 x 12  Patient transitioned from supine to sitting EOM with moderate assistance for righting trunk- Patient able to gradually place B LE off mat and used therapist's hand to assist with righting trunk.   At end of session patient transferred from wc to sitting EOB via slideboard and SBA for safety. Patient able to appropriately verbalize all steps of transfer, however continues to require total assistance for placing SB on L due to NWB restrictions.   Patient transitioned from sitting EOB to supine with moderate assistance for managing B LE while patient used bed rail with R UE to support his trunk.   Patient returned to his room supine in bed with tray table nearby, call bell within reach and all needs met.     Therapy Documentation Precautions:  Precautions Precautions: Fall Restrictions Weight Bearing Restrictions: Yes LUE Weight Bearing: Non  weight bearing RLE Weight Bearing: Non weight bearing LLE Weight Bearing: Non weight bearing    Therapy/Group: Individual Therapy  Norman Crawford 02/06/2022, 7:54 AM

## 2022-02-06 NOTE — Progress Notes (Signed)
Physical Therapy Session Note  Patient Details  Name: BOBBY BARTON MRN: [id number] Date of Birth: [birth year]  Today's Date: 02/06/2022 PT Individual Time: 9563-8756 + 1445-1530 PT Individual Time Calculation (min): 44 min  + 45 min  Short Term Goals: Week 2:  PT Short Term Goal 1 (Week 2): STG=LTG  Skilled Therapeutic Interventions/Progress Updates:      1st session: Pt seen sitting in "scooter" that he brought from home. Pt reporting feeling uncomfortable from firm cushion on scooter, requesting to return to bed and then to manual wheelchair. Pt also reporting that his scooter from home needs the height adjusted, unable to place BLE on foot plate due to some weight bearing through LE. Anticipate this may require +2 assist to adjust seat height due to weight of seat and placement of pins.   SB transfer from scooter to bed to manual wheelchair, all with CGA with assist for board placement. Pt with improved efficiency and understanding of setup for SB transfers. Pt compliant with WB restrictions throughout transfers.  Propelled himself independently using 1-wheel arm drive (RUE) wheelchair, 129f, from his room to main rehab gym and then back to his room at end of session.  Able to complete sitting to supine on mat table without assist, slightly pain limiting from R hip. Supine there-ex completed as follows: -SAQ with 5# ankle weight (on L only, no weight on R) using bolster under knees, 2x20 -Heel slides with no weight, using bolster under knees, 2x20 (painful on R when eccentric lowering) -Straight leg hip abduction with no weight, 2x20 bilaterally  He required min/modA for supine to sitting via log rolling technique, lying on his R side. Assist required for trunk support and BLE management.   Once in room, pt requesting to return to bed to rest prior to upcoming therapy. Assisted to bed in similar manner as above but did require modA for BLE management to reduce pain in R hip.   All  needs met, call bell in reach.   [facility] - direct hand[facility] of care. Pt continent of large BM - NT aware. [facility], towards his R. Pain limiting but compliant with WB restrictions. Pt c/o 5/10 R hip pain once EOB. His wheelchair has been delivered to room (20x16). Wrong size delivered and messaged CSW to order 20x18 instead. Pt completed SB transfer with setupA (wheelchair only) from EOB to wheelchair, continuing to be pain limiting. Once in w/c, pt reporting consistent 5/10 R hip pain and unable to get comfortable. Pt requesting to return to bed due to discomfort. SB transfer back to bed and then modA required for Sit>Supine for BLE management. Required ++ time for pain control. Repositioned in bed for comfort. Supine there-ex completed for hip abd/add stretching, heel cord stretching, and proximal hamstring stretching - all completed to comfort. Discussed DC planning during stretches - plan for personal car transfer to be completed tomorrow AM with wife - coordinated with scheduling to arrange - pt to let his spouse know. Bed concluded session in bed with all needs met.    Therapy Documentation Precautions:  Precautions Precautions: Fall Restrictions Weight Bearing Restrictions: Yes LUE Weight Bearing: Non weight bearing RLE Weight Bearing: Non weight bearing LLE Weight Bearing: Non weight bearing General:     Therapy/Group: Individual Therapy  CAlger Simons8/21/2023, 7:39 AM
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
## 2022-02-07 DIAGNOSIS — S32409S Unspecified fracture of unspecified acetabulum, sequela: Secondary | ICD-10-CM | POA: Diagnosis not present

## 2022-02-07 DIAGNOSIS — K5900 Constipation, unspecified: Secondary | ICD-10-CM | POA: Diagnosis not present

## 2022-02-07 DIAGNOSIS — T07XXXA Unspecified multiple injuries, initial encounter: Secondary | ICD-10-CM | POA: Diagnosis not present

## 2022-02-07 DIAGNOSIS — I10 Essential (primary) hypertension: Secondary | ICD-10-CM | POA: Diagnosis not present

## 2022-02-07 NOTE — Plan of Care (Signed)
  Problem: RH Balance ?Goal: LTG Patient will maintain dynamic sitting balance (PT) ?Description: LTG:  Patient will maintain dynamic sitting balance with assistance during mobility activities (PT) ?Outcome: Completed/Met ?  ?Problem: RH Bed Mobility ?Goal: LTG Patient will perform bed mobility with assist (PT) ?Description: LTG: Patient will perform bed mobility with assistance, with/without cues (PT). ?Outcome: Completed/Met ?  ?Problem: RH Bed to Chair Transfers ?Goal: LTG Patient will perform bed/chair transfers w/assist (PT) ?Description: LTG: Patient will perform bed to chair transfers with assistance (PT). ?Outcome: Completed/Met ?  ?Problem: RH Car Transfers ?Goal: LTG Patient will perform car transfers with assist (PT) ?Description: LTG: Patient will perform car transfers with assistance (PT). ?Outcome: Completed/Met ?  ?Problem: RH Wheelchair Mobility ?Goal: LTG Patient will propel w/c in controlled environment (PT) ?Description: LTG: Patient will propel wheelchair in controlled environment, # of feet with assist (PT) ?Outcome: Completed/Met ?Goal: LTG Patient will propel w/c in home environment (PT) ?Description: LTG: Patient will propel wheelchair in home environment, # of feet with assistance (PT). ?Outcome: Completed/Met ?  ?

## 2022-02-07 NOTE — Discharge Summary (Signed)
Physical Therapy Discharge Summary  Patient Details  Name: Norman Crawford MRN: [id number] Date of Birth: [birth year]  Date of Discharge from PT service:February 07, 2022  Today's Date: 02/07/2022 PT Individual Time: 0915-1027 PT Individual Time Calculation (min): 72 min    Patient has met 7 of 7 long term goals due to improved activity tolerance, improved balance, increased strength, decreased pain, and ability to compensate for deficits.  Patient to discharge at a wheelchair level [place].   Patient's care partner is independent to provide the necessary physical and cognitive assistance at discharge.  Reasons goals not met: n/a  Recommendation:  Patient will benefit from ongoing skilled PT services in home health setting to continue to advance safe functional mobility, address ongoing impairments in caregiver training, home safety, functional transfers, and minimize fall risk.  Equipment: 20x18 manual w/c with elevating leg rests, anti-tippers, and extended brake levers. Sliding board, Hospital bed  Reasons for discharge: treatment goals met and discharge from hospital  Patient/family agrees with progress made and goals achieved: Yes  PT Discharge Precautions/Restrictions Precautions Precautions: Fall Restrictions Weight Bearing Restrictions: Yes LUE Weight Bearing: Weight bear through elbow only RLE Weight Bearing: Non weight bearing LLE Weight Bearing: Non weight bearing Pain Interference Pain Interference Pain Effect on Sleep: 3. Frequently Pain Interference with Therapy Activities: 4. Almost constantly Pain Interference with Day-to-Day Activities: 4. Almost constantly Vision/Perception  Vision - History Ability to See in Adequate Light: 0 Adequate Perception Perception: Within Functional Limits Praxis Praxis: Intact  Cognition Overall Cognitive Status: Within Functional Limits for tasks assessed Arousal/Alertness: Awake/alert Orientation Level: Oriented  X4 Attention: Focused;Sustained;Selective;Alternating;Divided Focused Attention: Appears intact Sustained Attention: Appears intact Selective Attention: Appears intact Alternating Attention: Appears intact Divided Attention: Appears intact Memory: Appears intact Awareness: Appears intact Problem Solving: Appears intact Safety/Judgment: Appears intact Sensation Sensation Light Touch: Appears Intact Hot/Cold: Appears Intact Proprioception: Appears Intact Stereognosis: Appears Intact Coordination Gross Motor Movements are Fluid and Coordinated: No Coordination and Movement Description: Limited by R hip/pelvic pain Motor  Motor Motor: Other (comment) Motor - Discharge Observations: Generalized deconditioning and pelvic pain with mobility  Mobility Bed Mobility Bed Mobility: Rolling Right;Rolling Left;Sit to Supine;Supine to Sit Rolling Right: Independent with assistive device Rolling Left: Independent with assistive device Supine to Sit: Minimal Assistance - Patient > 75% Sit to Supine: Minimal Assistance - Patient > 75% Transfers Transfers: Lateral/Scoot Transfers Lateral/Scoot Transfers: Water engineer (Assistive device): Other (Comment) (sliding board) Locomotion  Gait Ambulation: No Gait Gait: No Stairs / Additional Locomotion Stairs: No Wheelchair Mobility Wheelchair Mobility: Yes Wheelchair Assistance: Independent with assistive device Wheelchair Propulsion: Right upper extremity Wheelchair Parts Management: Supervision/cueing Distance: 117f  Trunk/Postural Assessment  Cervical Assessment Cervical Assessment: Within Functional Limits Thoracic Assessment Thoracic Assessment: Within Functional Limits Lumbar Assessment Lumbar Assessment: Exceptions to [facility](limited pelvic mobility 2/2 pain) Postural Control Postural Control: Within Functional Limits  Balance Balance Balance Assessed: Yes Static Sitting Balance Static Sitting -  Balance Support: No upper extremity supported;Feet unsupported Static Sitting - Level of Assistance: 7: Independent Dynamic Sitting Balance Dynamic Sitting - Balance Support: No upper extremity supported;During functional activity Dynamic Sitting - Level of Assistance: 6: Modified independent (Device/Increase time) Extremity Assessment      RLE Assessment RLE Assessment: Exceptions to [facility] Strength RLE Overall Strength: Deficits;Due to pain Right Hip Flexion: 2-/5 Right Hip ABduction: 3+/5 Right Hip ADduction: 3+/5 Right Knee Flexion: 3+/5 Right Knee Extension: 4/5 Right Ankle Dorsiflexion: 4/5 LLE Assessment LLE Assessment: Exceptions to [facility]  Strength LLE Overall Strength: Deficits;Due to pain Left Hip Flexion: 2+/5 Left Hip ABduction: 3+/5 Left Hip ADduction: 3+/5 Left Knee Flexion: 3/5 Left Knee Extension: 4+/5 Left Ankle Dorsiflexion: 4/5    Skilled Intervention: Pt supine in bed to start with his wife at bedside. Focused session on family education/training to prepare for tomorrow's DC home where wife will be his primary caregiver. Pt denies resting pain but reports pain while sitting in manual wheelchair, rates 4/10 in R pelvis.   Wife assisting with bed mobility (minA) with hospital bed features. ++ time for pain management. Patient completing SB transfer to his personal electric scooter with wife assisting and PT providing supervision for safety. Wife providing CGA for safety. Pt able to direct care well and they work well with communication for safely completing functional mobility.   Pt propelled himself in electric scooter mod I from his room downstairs (via elevator) to outside near [facility], >571f. Focused outdoor time on "real" car transfer with their personal car - a large SUV. Car height 31inches (seat height ~28inches). Due to height difference, unable to safely transfer into the large SUV. Also attempted to complete from a curb but the gap between wheelchair  and car was too much for sliding board to safely transfer. [facility] - both voiced understanding.  Completed sliding board transfer from electric scooter to manual w/c with CGA, assist for board placement and w/c setup. Assisted wife in placing electric scooter in trunk of large SUV via ramp. Pt transported back upstairs to CIR and pt requesting to return to bed due to R pelvic discomfort. Wife assisted with setup and SB transfer with PT providing supervision without cues or assist. Wife also assisting with bed mobility to safely transition patient into supine.  Focused remainder of session on educating spouse on bed level stretching techniques - hamstring, hip adductors, piriformis/posterior capsule. Family asking for HEP handout - messaged f/u therapist to develop due to time constraints.   All questions and concerns addressed from patient/family re: home and DC planning. Family reporting confidence in DC plan and looking forward to transitioning home. All needs met at end of session  CAlger SimonsPT, DPT 02/07/2022, 7:43 AM

## 2022-02-07 NOTE — Progress Notes (Signed)
Occupational Therapy Discharge Summary  Patient Details  Name: Norman Crawford MRN: [id number] Date of Birth: 04/20/1966  Date of Discharge from OT service:February 07, 2022  Today's Date: 02/07/2022 OT Individual Time: 0370-4888 OT Individual Time Calculation (min): 71 min    Patient has met 6 of 6 long term goals due to improved activity tolerance, improved balance, ability to compensate for deficits, and functional use of  LEFT upper extremity.  Patient to discharge at [facility] Assist level.  Patient's care partner is independent to provide the necessary physical assistance at discharge.    Reasons goals not met: Improved awareness of moving within restrictions. Improved activity tolerance / BP stabilized  Recommendation:  Patient will benefit from ongoing skilled OT services in home health setting to continue to advance functional skills in the area of BADL.  Equipment: Drop arm wide BSC, Tub bench system, wheelchair, slide board, scooter  Reasons for discharge: treatment goals met and discharge from hospital  Patient/family agrees with progress made and goals achieved: Yes  Skilled Intervention:   Patient declined shower this am but eager to get up out of bed to shave.  Patient already dressed in pants for the day, and has shirt out to change.  Patient transferred to wheelchair versus scooter for comfort.  Patient able to don pants and socks.  Shaved and completed grooming at sink.  Patient's wife arrived. Fielded questions regarding discharge tomorrow.  Wife has video of bathroom set up at home, and shared shower system.  Patient has to use scooter for shower at home due to tight footprint.  Patient transferred back to bed as demo for wife, then wife transferred patient to scooter with cueing.  Patient able to help guide his care.  Transported in scooter down to tub room to review process / set up for tub/shower transfer.  Reviewed steps to take if patient becomes light headed in  shower.  Patient drove scooter back to room and unable to remain up for PT session due to discomfort despite additional cushion on seat.  Patient assisted back to bed, with personal items in reach.   OT Discharge Precautions/Restrictions  Precautions Precautions: Fall Restrictions Weight Bearing Restrictions: Yes RLE Weight Bearing: Non weight bearing LLE Weight Bearing: Non weight bearing   Pain Pain Assessment Pain Scale: 0-10 Pain Score: 0-No pain Pain Type: Acute pain Pain Location: Hip Pain Orientation: Right Pain Descriptors / Indicators: Aching Pain Onset: With Activity ADL ADL Equipment Provided: Sock aid Eating: Independent Where Assessed-Eating: Chair, Edge of bed Grooming: Independent Where Assessed-Grooming: Sitting at sink Upper Body Bathing: Minimal assistance Where Assessed-Upper Body Bathing: Shower Lower Body Bathing: Minimal assistance Where Assessed-Lower Body Bathing: Shower Upper Body Dressing: Independent Where Assessed-Upper Body Dressing: Sitting at sink, Edge of bed Lower Body Dressing: Modified independent Where Assessed-Lower Body Dressing: Bed level Toileting: Minimal assistance Where Assessed-Toileting: Bedside Commode Toilet Transfer: Therapist, music Method: Theatre manager: Drop arm bedside commode Tub/Shower Transfer: Metallurgist Method: Administrator, arts: Facilities manager: Unable to assess Social research officer, government Method: Unable to assess ADL Comments: Pt already dressed and reports desire to take a shower. OT provides education on the need for increased time to completing showering task and that OT needs to fine appropriate showering equipment for safety with transfer. Pt able to complete supine <> sit transfer with L exit from bed with supervision and use of RUE on trapeze and bed rails to management trunk.  Pt able to complete slide  board transfer to the L from EOB <> w/c with minimal - moderate assistance. Increased assistance needed for initial scoot and hip clearance. Once seated in w/c pt progressively reports s/s of orthostatic hypotension. BP was taken and was 106/68. Pt transferred back to bed to relieve symptoms and prep for toileting if needed after OT session. Vision Baseline Vision/History: 1 Wears glasses Patient Visual Report: No change from baseline Vision Assessment?: No apparent visual deficits Perception  Perception: Within Functional Limits Praxis Praxis: Intact Cognition Cognition Overall Cognitive Status: Within Functional Limits for tasks assessed Arousal/Alertness: Awake/alert Orientation Level: Person;Place;Situation Person: Oriented Place: Oriented Situation: Oriented Memory: Appears intact Attention: Divided Focused Attention: Appears intact Sustained Attention: Appears intact Selective Attention: Appears intact Divided Attention: Appears intact Awareness: Appears intact Problem Solving: Appears intact Safety/Judgment: Appears intact Brief Interview for Mental Status (BIMS) Repetition of Three Words (First Attempt): 3 Temporal Orientation: Year: Correct Temporal Orientation: Month: Accurate within 5 days Temporal Orientation: Day: Correct Recall: "Sock": Yes, no cue required Recall: "Blue": Yes, no cue required Recall: "Bed": Yes, no cue required BIMS Summary Score: 15 Sensation Sensation Light Touch: Appears Intact Hot/Cold: Appears Intact Proprioception: Appears Intact Stereognosis: Appears Intact Coordination Gross Motor Movements are Fluid and Coordinated: No Fine Motor Movements are Fluid and Coordinated: No Finger Nose Finger Test: Limited use of non dominant LUE due to Left splint Motor  Motor Motor - Discharge Observations: Generalized deconditioning and pelvic pain with mobility Mobility  Bed Mobility Bed Mobility: Rolling Right;Rolling Left;Sit to Supine;Supine  to Sit Rolling Right: Independent with assistive device Rolling Left: Independent with assistive device Supine to Sit: Minimal Assistance - Patient > 75% Sitting - Scoot to Edge of Bed: Supervision/Verbal cueing Sit to Supine: Minimal Assistance - Patient > 75%  Trunk/Postural Assessment  Cervical Assessment Cervical Assessment: Within Functional Limits Thoracic Assessment Thoracic Assessment: Within Functional Limits Lumbar Assessment Lumbar Assessment: Exceptions to [facility] Postural Control Postural Control: Deficits on evaluation (limited ability to weight shift toward right in sitting and lying/sidelying)  Balance Balance Balance Assessed: Yes Static Sitting Balance Static Sitting - Balance Support: No upper extremity supported;Feet unsupported Static Sitting - Level of Assistance: 7: Independent Dynamic Sitting Balance Dynamic Sitting - Balance Support: No upper extremity supported;During functional activity Dynamic Sitting - Level of Assistance: 6: Modified independent (Device/Increase time) Extremity/Trunk Assessment RUE Assessment RUE Assessment: Within Functional Limits LUE Assessment LUE Assessment: Exceptions to [facility] Active Range of Motion (AROM) Comments: WFL Shoulder - elbow/forearm/ wrist NT due to [place] like cast General Strength Comments: LUE Shoulder 4/5 LUE Body System: Jone Baseman 02/07/2022, 12:21 PM

## 2022-02-07 NOTE — Progress Notes (Signed)
Physical Therapy Session Note  Patient Details  Name: Norman Crawford MRN: [id number] Date of Birth: 1965/12/24  Today's Date: 02/07/2022 PT Individual Time: 1415-1500 PT Individual Time Calculation (min): 45 min   Short Term Goals: Week 2:  PT Short Term Goal 1 (Week 2): STG=LTG  Skilled Therapeutic Interventions/Progress Updates:  Patient greeted supine in bed and agreeable to PT treatment session. Patient reporting 2-3/10 pain at rest and a 4-5/10 pain with functional mobility and with sustained sitting in new manual wheelchair, however able to complete treatment session.   Patient transitioned from supine to sitting EOB via control panel on hospital bed, which patient will have at home, and with the use of a bed ladder to assist with righting his trunk.   Patient transferred to/from sitting EOB and wc via slideboard and SBA for safety- Patient continues to require total assistance for slideboard placement and removal, however no physical assistance required throughout transfer. Patient able to verbalize wheelchair set-up, etc prior to transfer.   Patient was brought to the main rehab gym where therapist and patient discussed HEP at length with demonstration, explanation and patient providing input on what type of exercises and stretches he would like to perform at home. Patient educated regarding listening to his body and adjusting sets/reps/sustained stretching accordingly- Patient reported understanding. All questions regarding HEP were answered throughout treatment session with modifications discussed. HEP was placed inside patient's folder in his room with patient aware.  Access Code: [email or web address] URL: [email or web address] Date: 02/07/2022 Prepared by: Jodi Mourning Dimitriy Carreras  Supine Exercises - Supine Short Arc Quad  - 1 x daily - 7 x weekly - 3 sets - 10 reps - Hooklying Isometric Hip Abduction Adduction with Belt and Ball  - 1 x daily - 7 x weekly - 3 sets - 10 reps - 3 sec  hold - Supine Ankle Pumps  - 1 x daily - 7 x weekly - 3 sets - 10 reps - Supine Single Knee to Chest Stretch  - 1 x daily - 7 x weekly - 3 sets - 10 reps - Supine Gluteal Sets  - 1 x daily - 7 x weekly - 3 sets - 10 reps - 3 sec hold - Ab Prep  - 1 x daily - 7 x weekly - 3 sets - 10 reps - Obliques  - 1 x daily - 7 x weekly - 3 sets - 10 reps  Supine Caregiver Stretches - Hip Abduction and Adduction Caregiver PROM  - 1 x daily - 7 x weekly - Hip and Knee Extension and Flexion Caregiver PROM  - 1 x daily - 7 x weekly - Toe Extension and Flexion Caregiver PROM  - 1 x daily - 7 x weekly - Hip Flexion with Knee Extension Caregiver PROM  - 1 x daily - 7 x weekly - Supine Hamstring Stretch with Caregiver  - 1 x daily - 7 x weekly - Caregiver PROM Hip Internal External Rotation  - 1 x daily - 7 x weekly    At the end of treatment session, patient transitioned from sitting EOB to supine with moderate assistance for managing B LE secondary to weakness and pain. Patient was able to manage his trunk and reposition once supine in bed.   Patient returned to his room supine in bed with call bell within reach and all needs met.    Therapy Documentation Precautions:  Precautions Precautions: Fall Restrictions Weight Bearing Restrictions: Yes LUE Weight Bearing: Weight bear through elbow only RLE Weight  Bearing: Non weight bearing LLE Weight Bearing: Non weight bearing   Therapy/Group: Individual Therapy  Lavora Brisbon 02/07/2022, 8:26 AM
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
## 2022-02-07 NOTE — Progress Notes (Signed)
Inpatient Rehabilitation Discharge Medication Review by a Pharmacist  A complete drug regimen review was completed for this patient to identify any potential clinically significant medication issues.  High Risk Drug Classes Is patient taking? Indication by Medication  Antipsychotic No   Anticoagulant No   Antibiotic No   Opioid Yes oxyIR- acute pain  Antiplatelet No   Hypoglycemics/insulin No   Vasoactive Medication Yes Atenolol, flomax- HTN / BPH  Chemotherapy No   Other Yes Lipitor- HLD Zyloprim- gout Lexapro- MDD Trazodone- sleep Vitamin D- supplementation s/p ortho surgery     Type of Medication Issue Identified Description of Issue Recommendation(s)  Drug Interaction(s) (clinically significant)     Duplicate Therapy     Allergy     No Medication Administration End Date     Incorrect Dose     Additional Drug Therapy Needed     Significant med changes from prior encounter (inform family/care partners about these prior to discharge).    Other       Clinically significant medication issues were identified that warrant physician communication and completion of prescribed/recommended actions by midnight of the next day:  No  Time spent performing this drug regimen review (minutes):  30   Wahneta Derocher BS, PharmD, BCPS Clinical Pharmacist 02/08/2022 7:19 AM  Contact: [phone] after 3 PM  "Be curious, not judgmental..." -Debbora Dus

## 2022-02-07 NOTE — Progress Notes (Signed)
Occupational Therapy Session Note  Patient Details  Name: Norman Crawford MRN: [id number] Date of Birth: 08-Apr-1966  Today's Date: 02/07/2022 OT Individual Time: 1132-1157 OT Individual Time Calculation (min): 25 min    Short Term Goals: Week 2:   STG= LTG  Skilled Therapeutic Interventions/Progress Updates:    Finalized family education and discharge planning with patient and wife.  Reviewed where physical assistance is required and that patient could be left alone for 2-3 hours if all toileting needs addressed prior.   Therapy Documentation Precautions:  Precautions Precautions: Fall Restrictions Weight Bearing Restrictions: Yes LUE Weight Bearing: Weight bear through elbow only RLE Weight Bearing: Non weight bearing LLE Weight Bearing: Non weight bearing   Pain: Pain Assessment Pain Scale: 0-10 Pain Score: 0-No pain Pain Type: Acute pain Pain Location: Hip Pain Orientation: Right Pain Descriptors / Indicators: Aching Pain Onset: With Activity     Therapy/Group: Individual Therapy  [name] 02/07/2022, 12:35 PM

## 2022-02-07 NOTE — Progress Notes (Signed)
Inpatient Rehabilitation Care Coordinator Discharge Note   Patient Details  Name: Norman Crawford MRN: [id number] Date of Birth: 05-17-1966   Discharge location: HOME WITH WIFE WHO IS TAKING FMLA TO PROVIDE ASSISTANCE TO HIM  Length of Stay: 14 days  Discharge activity level: SUPERVISION-MIN WHEELCHAIR LEVEL DUE TO WB ISSUES  Home/community participation: ACTIVE  Patient response WU:JWJXBJ Literacy - How often do you need to have someone help you when you read instructions, pamphlets, or other written material from your doctor or pharmacy?: Never  Patient response YN:WGNFAO Isolation - How often do you feel lonely or isolated from those around you?: Never  Services provided included: MD, RD, PT, OT, RN, CM, Pharmacy, Neuropsych, SW  Financial Services:  Field seismologist Utilized: Physiological scientist  Choices offered to/list presented to: PT AND WIFE  Follow-up services arranged:  DME, Patient/Family has no preference for HH/DME agencies      DME : [employer]-WHEEL[facility]IR AND TRANSFER BOARD  HAS ALL OTHER EQUIPMENT FROM WIFE'S DAD   WILL NEED FOLLOW UP ONCE CAN WB AND THEN WILL NEED OP THERAPIES  Patient response to transportation need: Is the patient able to respond to transportation needs?: Yes In the past 12 months, has lack of transportation kept you from medical appointments or from getting medications?: No In the past 12 months, has lack of transportation kept you from meetings, work, or from getting things needed for daily living?: No    Comments (or additional information): PT DID WELL AND REACHED GOALS FOR WHEELCHAIR LEVEL DUE TO NWB B-LE. WIFE WAS HERE FOR HANDS ON EDUCATION AND WILL TAKE FMLA TO PROVIDE ASSISTANCE AT HOME  Patient/Family verbalized understanding of follow-up arrangements:  Yes  Individual responsible for coordination of the follow-up plan: SELF AND Norman Crawford [phone]  Confirmed correct DME delivered: Norman Crawford 02/07/2022     Norman Crawford, Norman Crawford

## 2022-02-08 ENCOUNTER — Inpatient Hospital Stay (HOSPITAL_COMMUNITY): Payer: No Typology Code available for payment source

## 2022-02-08 ENCOUNTER — Other Ambulatory Visit: Payer: Self-pay

## 2022-02-08 DIAGNOSIS — S62102A Fracture of unspecified carpal bone, left wrist, initial encounter for closed fracture: Secondary | ICD-10-CM

## 2022-02-08 DIAGNOSIS — R000 Tachycardia, unspecified: Secondary | ICD-10-CM

## 2022-02-08 DIAGNOSIS — E785 Hyperlipidemia, unspecified: Secondary | ICD-10-CM

## 2022-02-08 DIAGNOSIS — S32409S Unspecified fracture of unspecified acetabulum, sequela: Secondary | ICD-10-CM | POA: Diagnosis not present

## 2022-02-08 DIAGNOSIS — F32A Depression, unspecified: Secondary | ICD-10-CM

## 2022-02-08 DIAGNOSIS — S3289XA Fracture of other parts of pelvis, initial encounter for closed fracture: Secondary | ICD-10-CM

## 2022-02-08 MED ORDER — CAMPHOR-MENTHOL 0.5-0.5 % EX LOTN: TOPICAL_LOTION | CUTANEOUS | 0 refills | Status: DC | PRN

## 2022-02-08 MED ORDER — ACETAMINOPHEN 325 MG PO TABS: 325.0000 mg | ORAL_TABLET | ORAL | Status: AC | PRN

## 2022-02-08 MED ORDER — DOCUSATE SODIUM 100 MG PO CAPS: 100.0000 mg | ORAL_CAPSULE | Freq: Two times a day (BID) | ORAL | 0 refills | Status: AC | PRN

## 2022-02-08 MED ORDER — MELATONIN 5 MG PO TABS: 5.0000 mg | ORAL_TABLET | Freq: Every day | ORAL | 0 refills | Status: AC

## 2022-02-08 MED ORDER — ADULT MULTIVITAMIN W/MINERALS CH: 1.0000 | ORAL_TABLET | Freq: Every day | ORAL | Status: AC

## 2022-02-08 MED ORDER — ATORVASTATIN CALCIUM 20 MG PO TABS: 20.0000 mg | ORAL_TABLET | Freq: Every day | ORAL | 0 refills | Status: AC

## 2022-02-08 MED ORDER — SENNOSIDES-DOCUSATE SODIUM 8.6-50 MG PO TABS: 2.0000 | ORAL_TABLET | Freq: Every evening | ORAL | Status: AC | PRN

## 2022-02-08 MED ORDER — ATENOLOL 25 MG PO TABS: 25.0000 mg | ORAL_TABLET | Freq: Every morning | ORAL | 0 refills | Status: DC

## 2022-02-08 MED ORDER — OXYCODONE HCL 5 MG PO TABS: 5.0000 mg | ORAL_TABLET | Freq: Four times a day (QID) | ORAL | 0 refills | Status: DC | PRN

## 2022-02-08 MED ORDER — TAMSULOSIN HCL 0.4 MG PO CAPS: 0.8000 mg | ORAL_CAPSULE | Freq: Every day | ORAL | 0 refills | Status: DC

## 2022-02-08 MED ORDER — VITAMIN D (ERGOCALCIFEROL) 1.25 MG (50000 UNIT) PO CAPS: 50000.0000 [IU] | ORAL_CAPSULE | ORAL | 0 refills | Status: AC

## 2022-02-08 MED ORDER — ESCITALOPRAM OXALATE 10 MG PO TABS: 10.0000 mg | ORAL_TABLET | Freq: Every day | ORAL | 0 refills | Status: DC

## 2022-02-08 MED ORDER — TRAZODONE HCL 50 MG PO TABS
25.0000 mg | ORAL_TABLET | Freq: Every evening | ORAL | 0 refills | Status: DC | PRN
Start: 2022-02-08 — End: 2022-02-15
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
## 2022-02-08 NOTE — Progress Notes (Signed)
Patient ID: Norman Crawford, male   DOB: May 29, 1966, 56 y.o.   MRN: [id number]  Have addressed again the reason to save his therapy visits. He and wife understand and will contact this worker if once home have issues and then can re-visit if home health services are needed. Have updated MD

## 2022-02-09 ENCOUNTER — Telehealth: Payer: Self-pay | Admitting: *Deleted

## 2022-02-09 NOTE — Telephone Encounter (Signed)
Transitional Care call--I spoke with Mr and Mrs Tiger    Are you/is patient experiencing any problems since coming home? Are there any questions regarding any aspect of care? NO Are there any questions regarding medications administration/dosing? Are meds being taken as prescribed? Patient should review meds with caller to confirm They confirm they have the medications Have there been any falls? NO Has Home Health been to the house and/or have they contacted you? If not, have you tried to contact them? Can we help you contact them? No HH at this time until he is able to bear weight, then OP will be arranged. Are bowels and bladder emptying properly? Are there any unexpected incontinence issues? If applicable, is patient following bowel/bladder programs? NO problems Any fevers, problems with breathing, unexpected pain? NO, has not been taking Oxycodone currently. Are there any skin problems or new areas of breakdown? NO Has the patient/family member arranged specialty MD follow up (ie cardiology/neurology/renal/surgical/etc)?  Can we help arrange? They spoke with PCP and will call Dr Stephan Minister tomorrow. Appt given to see Dr Carlis Abbott. Does the patient need any other services or support that we can help arrange? NO Are caregivers following through as expected in assisting the patient? YES Has the patient quit smoking, drinking alcohol, or using drugs as recommended? YES  Appointment 02/21/2022 arrive 2:40 for 3:00 appt with Dr Carlis Abbott. [address] [place], [place] [address] Alerted to watch for packet from our office in mail and to fill out before appt.

## 2022-02-09 NOTE — Telephone Encounter (Signed)
Transition Care Management Unsuccessful Follow-up Telephone Call  Date of discharge and from where:  [facility] 02/08/22  Attempts:  1st Attempt  Reason for unsuccessful TCM follow-up call:  Left voice message requesting a call back for questions about transition to home after discharge. Left appt date and time and alerted to watch mail for packet from our office with paperwork.     Appointment 02/21/2022 arrive 2:40 for 3:00 appt with Dr Carlis Abbott. [address] [place], [place] [address]

## 2022-02-15 ENCOUNTER — Telehealth: Payer: Self-pay

## 2022-02-15 ENCOUNTER — Other Ambulatory Visit: Payer: Self-pay | Admitting: Physician Assistant

## 2022-02-15 NOTE — Telephone Encounter (Signed)
Called and spoke w/ Eunice Blase and gave verbal order for continuous leave

## 2022-02-15 NOTE — Telephone Encounter (Signed)
[employer] calling in regarding updated FMLA information for spouse Norman Crawford, She advised dates were given for 7/31-10/31 and it was not noted whether it was intermittent or continuous, an update needs to be provided with complete parameters as to whether it will be intermittent or continuous, for the updated paperwork it can be either Faxed to [phone] just make sure to initial and date any changes or you can call and give verbal orders to [phone].

## 2022-02-17 ENCOUNTER — Encounter
Payer: No Typology Code available for payment source | Attending: Physical Medicine and Rehabilitation | Admitting: Physical Medicine and Rehabilitation

## 2022-02-17 ENCOUNTER — Encounter: Payer: Self-pay | Admitting: Physical Medicine and Rehabilitation

## 2022-02-17 VITALS — BP 129/88 | HR 85 | Ht 69.0 in

## 2022-02-17 DIAGNOSIS — T07XXXA Unspecified multiple injuries, initial encounter: Secondary | ICD-10-CM | POA: Insufficient documentation

## 2022-02-17 DIAGNOSIS — N529 Male erectile dysfunction, unspecified: Secondary | ICD-10-CM | POA: Diagnosis present

## 2022-02-17 DIAGNOSIS — S32409S Unspecified fracture of unspecified acetabulum, sequela: Secondary | ICD-10-CM | POA: Diagnosis present

## 2022-02-17 DIAGNOSIS — R200 Anesthesia of skin: Secondary | ICD-10-CM | POA: Diagnosis present

## 2022-02-17 MED ORDER — ATENOLOL 25 MG PO TABS: 25.0000 mg | ORAL_TABLET | Freq: Every morning | ORAL | 3 refills | Status: AC

## 2022-02-17 MED ORDER — TAMSULOSIN HCL 0.4 MG PO CAPS
0.8000 mg | ORAL_CAPSULE | Freq: Every day | ORAL | 3 refills | Status: AC
Start: 2022-02-17 — End: ?

## 2022-02-17 MED ORDER — OXYCODONE HCL 5 MG PO TABS: 5.0000 mg | ORAL_TABLET | Freq: Two times a day (BID) | ORAL | 0 refills | Status: AC | PRN

## 2022-02-17 MED ORDER — SILDENAFIL CITRATE 100 MG PO TABS: 100.0000 mg | ORAL_TABLET | Freq: Every day | ORAL | 3 refills | Status: AC | PRN

## 2022-02-17 NOTE — Patient Instructions (Addendum)
Constipation:  -Provided list of following foods that help with constipation and highlighted a few: 1) prunes- contain high amounts of fiber.  2) apples- has a form of dietary fiber called pectin that accelerates stool movement and increases beneficial gut bacteria 3) pears- in addition to fiber, also high in fructose and sorbitol which have laxative effect 4) figs- contain an enzyme ficin which helps to speed colonic transit 5) kiwis- contain an enzyme actinidin that improves gut motility and reduces constipation 6) oranges- rich in pectin (like apples) 7) grapefruits- contain a flavanol naringenin which has a laxative effect 8) vegetables- rich in fiber and also great sources of folate, vitamin C, and K 9) artichoke- high in inulin, prebiotic great for the microbiome 10) chicory- increases stool frequency and softness (can be added to coffee) 11) rhubarb- laxative effect 12) sweet potato- high fiber 13) beans, peas, and lentils- contain both soluble and insoluble fiber 14) chia seeds- improves intestinal health and gut flora 15) flaxseeds- laxative effect 16) whole grain rye bread- high in fiber 17) oat bran- high in soluble and insoluble fiber 18) kefir- softens stools -recommended to try at least one of these foods every day.  -drink 6-8 glasses of water per day -walk regularly, especially after meals.   Foods that may reduce pain: 1) Ginger (especially studied for arthritis)- reduce leukotriene production to decrease inflammation 2) Blueberries- high in phytonutrients that decrease inflammation 3) Salmon- marine omega-3s reduce joint swelling and pain 4) Pumpkin seeds- reduce inflammation 5) dark chocolate- reduces inflammation 6) turmeric- reduces inflammation 7) tart cherries - reduce pain and stiffness 8) extra virgin olive oil - its compound olecanthal helps to block prostaglandins  9) chili peppers- can be eaten or applied topically via capsaicin 10) mint- helpful for  headache, muscle aches, joint pain, and itching 11) garlic- reduces inflammation  Link to further information on diet for chronic pain: [email or web address]   Coenzyme Q10
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
## 2022-02-21 ENCOUNTER — Encounter: Payer: No Typology Code available for payment source | Admitting: Physical Medicine and Rehabilitation

## 2022-02-22 ENCOUNTER — Telehealth: Payer: Self-pay

## 2022-02-22 ENCOUNTER — Other Ambulatory Visit: Payer: Self-pay | Admitting: Physical Medicine and Rehabilitation

## 2022-02-22 NOTE — Telephone Encounter (Signed)
Patient only needed Prior Authorization. The office is working on the [place].

## 2022-02-22 NOTE — Telephone Encounter (Signed)
PA for Oxycodone sent to insurance through CoverMyMeds 

## 2022-02-22 NOTE — Telephone Encounter (Signed)
We're pleased to let you know that we've approved your or your doctor's request for coverage for oxyCODONE HCl 5MG  OR TABS. You can now fill your prescription, and it will be covered according to your plan. As long as you remain covered by your prescription drug plan and there are no changes to your plan benefits, this request is approved from 02/22/2022 to 08/21/2022. When this approval expires, please speak to your doctor about your treatment.

## 2022-02-22 NOTE — Telephone Encounter (Signed)
Patient requesting a refill of pain medication.  PMP Filled  Written  ID  Drug  QTY  Days  Prescriber  RX #  Dispenser  Refill  Daily Dose*  Pymt Type  PMP  02/08/2022 02/08/2022 1  Oxycodone Hcl (Ir) 5 Mg Tablet 20.00 5 Sa Set [id number] Nor (9290) 0/0 30.00 MME Comm Ins [place]

## 2022-02-23 ENCOUNTER — Encounter: Payer: Self-pay | Admitting: Physical Medicine and Rehabilitation
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
## 2022-04-05 ENCOUNTER — Encounter: Payer: Self-pay | Admitting: Physical Therapy

## 2022-04-05 ENCOUNTER — Other Ambulatory Visit: Payer: Self-pay

## 2022-04-05 ENCOUNTER — Ambulatory Visit: Payer: No Typology Code available for payment source | Attending: Student | Admitting: Physical Therapy

## 2022-04-05 DIAGNOSIS — M25632 Stiffness of left wrist, not elsewhere classified: Secondary | ICD-10-CM | POA: Diagnosis present

## 2022-04-05 DIAGNOSIS — M25532 Pain in left wrist: Secondary | ICD-10-CM | POA: Insufficient documentation

## 2022-04-05 DIAGNOSIS — M25552 Pain in left hip: Secondary | ICD-10-CM | POA: Insufficient documentation

## 2022-04-05 DIAGNOSIS — R29898 Other symptoms and signs involving the musculoskeletal system: Secondary | ICD-10-CM | POA: Insufficient documentation

## 2022-04-05 DIAGNOSIS — M6281 Muscle weakness (generalized): Secondary | ICD-10-CM | POA: Diagnosis present

## 2022-04-05 DIAGNOSIS — M25551 Pain in right hip: Secondary | ICD-10-CM | POA: Insufficient documentation

## 2022-04-05 DIAGNOSIS — R2689 Other abnormalities of gait and mobility: Secondary | ICD-10-CM | POA: Insufficient documentation

## 2022-04-05 DIAGNOSIS — R600 Localized edema: Secondary | ICD-10-CM | POA: Insufficient documentation

## 2022-04-07 ENCOUNTER — Ambulatory Visit: Payer: No Typology Code available for payment source

## 2022-04-07 DIAGNOSIS — M25552 Pain in left hip: Secondary | ICD-10-CM

## 2022-04-07 DIAGNOSIS — R2689 Other abnormalities of gait and mobility: Secondary | ICD-10-CM

## 2022-04-07 DIAGNOSIS — M25532 Pain in left wrist: Secondary | ICD-10-CM

## 2022-04-07 DIAGNOSIS — R29898 Other symptoms and signs involving the musculoskeletal system: Secondary | ICD-10-CM

## 2022-04-07 DIAGNOSIS — M25551 Pain in right hip: Secondary | ICD-10-CM

## 2022-04-07 DIAGNOSIS — R600 Localized edema: Secondary | ICD-10-CM

## 2022-04-07 DIAGNOSIS — M6281 Muscle weakness (generalized): Secondary | ICD-10-CM

## 2022-04-07 DIAGNOSIS — M25632 Stiffness of left wrist, not elsewhere classified: Secondary | ICD-10-CM
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
## 2022-04-10 ENCOUNTER — Encounter: Payer: Self-pay | Admitting: Physical Therapy

## 2022-04-10 ENCOUNTER — Ambulatory Visit: Payer: No Typology Code available for payment source | Admitting: Physical Therapy

## 2022-04-10 DIAGNOSIS — R2689 Other abnormalities of gait and mobility: Secondary | ICD-10-CM

## 2022-04-10 DIAGNOSIS — M25632 Stiffness of left wrist, not elsewhere classified: Secondary | ICD-10-CM

## 2022-04-10 DIAGNOSIS — R600 Localized edema: Secondary | ICD-10-CM

## 2022-04-10 DIAGNOSIS — M25552 Pain in left hip: Secondary | ICD-10-CM | POA: Diagnosis not present

## 2022-04-10 DIAGNOSIS — M6281 Muscle weakness (generalized): Secondary | ICD-10-CM

## 2022-04-10 DIAGNOSIS — M25551 Pain in right hip: Secondary | ICD-10-CM

## 2022-04-10 DIAGNOSIS — M25532 Pain in left wrist: Secondary | ICD-10-CM

## 2022-04-10 DIAGNOSIS — R29898 Other symptoms and signs involving the musculoskeletal system: Secondary | ICD-10-CM
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
## 2022-04-14 ENCOUNTER — Ambulatory Visit: Payer: No Typology Code available for payment source

## 2022-04-17 ENCOUNTER — Ambulatory Visit: Payer: No Typology Code available for payment source

## 2022-04-18 ENCOUNTER — Encounter: Payer: No Typology Code available for payment source | Admitting: Physical Therapy
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
## 2022-04-20 ENCOUNTER — Ambulatory Visit: Payer: No Typology Code available for payment source | Attending: Student | Admitting: Physical Therapy

## 2022-04-20 ENCOUNTER — Encounter: Payer: Self-pay | Admitting: Physical Therapy

## 2022-04-20 DIAGNOSIS — M6281 Muscle weakness (generalized): Secondary | ICD-10-CM | POA: Diagnosis present

## 2022-04-20 DIAGNOSIS — M25552 Pain in left hip: Secondary | ICD-10-CM | POA: Diagnosis not present

## 2022-04-20 DIAGNOSIS — R2689 Other abnormalities of gait and mobility: Secondary | ICD-10-CM | POA: Insufficient documentation

## 2022-04-20 DIAGNOSIS — R600 Localized edema: Secondary | ICD-10-CM | POA: Insufficient documentation

## 2022-04-20 DIAGNOSIS — M25551 Pain in right hip: Secondary | ICD-10-CM | POA: Diagnosis present

## 2022-04-20 DIAGNOSIS — M25632 Stiffness of left wrist, not elsewhere classified: Secondary | ICD-10-CM | POA: Insufficient documentation

## 2022-04-20 DIAGNOSIS — R29898 Other symptoms and signs involving the musculoskeletal system: Secondary | ICD-10-CM | POA: Insufficient documentation

## 2022-04-20 DIAGNOSIS — M25532 Pain in left wrist: Secondary | ICD-10-CM | POA: Diagnosis present

## 2022-04-24 ENCOUNTER — Ambulatory Visit: Payer: No Typology Code available for payment source | Admitting: Physical Therapy

## 2022-04-24 ENCOUNTER — Encounter: Payer: Self-pay | Admitting: Physical Therapy

## 2022-04-24 DIAGNOSIS — R2689 Other abnormalities of gait and mobility: Secondary | ICD-10-CM

## 2022-04-24 DIAGNOSIS — M25551 Pain in right hip: Secondary | ICD-10-CM

## 2022-04-24 DIAGNOSIS — M25552 Pain in left hip: Secondary | ICD-10-CM | POA: Diagnosis not present

## 2022-04-24 DIAGNOSIS — M6281 Muscle weakness (generalized): Secondary | ICD-10-CM

## 2022-04-24 DIAGNOSIS — R29898 Other symptoms and signs involving the musculoskeletal system: Secondary | ICD-10-CM

## 2022-04-24 DIAGNOSIS — M25532 Pain in left wrist: Secondary | ICD-10-CM

## 2022-04-24 DIAGNOSIS — M25632 Stiffness of left wrist, not elsewhere classified: Secondary | ICD-10-CM
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
## 2022-04-27 ENCOUNTER — Ambulatory Visit: Payer: No Typology Code available for payment source

## 2022-04-27 DIAGNOSIS — M6281 Muscle weakness (generalized): Secondary | ICD-10-CM

## 2022-04-27 DIAGNOSIS — R29898 Other symptoms and signs involving the musculoskeletal system: Secondary | ICD-10-CM

## 2022-04-27 DIAGNOSIS — M25632 Stiffness of left wrist, not elsewhere classified: Secondary | ICD-10-CM

## 2022-04-27 DIAGNOSIS — M25551 Pain in right hip: Secondary | ICD-10-CM

## 2022-04-27 DIAGNOSIS — R2689 Other abnormalities of gait and mobility: Secondary | ICD-10-CM

## 2022-04-27 DIAGNOSIS — M25532 Pain in left wrist: Secondary | ICD-10-CM

## 2022-04-27 DIAGNOSIS — M25552 Pain in left hip: Secondary | ICD-10-CM
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
## 2022-05-01 ENCOUNTER — Encounter: Payer: Self-pay | Admitting: Physical Therapy

## 2022-05-01 ENCOUNTER — Ambulatory Visit: Payer: No Typology Code available for payment source | Admitting: Physical Therapy

## 2022-05-01 DIAGNOSIS — M25551 Pain in right hip: Secondary | ICD-10-CM

## 2022-05-01 DIAGNOSIS — M6281 Muscle weakness (generalized): Secondary | ICD-10-CM

## 2022-05-01 DIAGNOSIS — M25632 Stiffness of left wrist, not elsewhere classified: Secondary | ICD-10-CM

## 2022-05-01 DIAGNOSIS — R2689 Other abnormalities of gait and mobility: Secondary | ICD-10-CM

## 2022-05-01 DIAGNOSIS — M25552 Pain in left hip: Secondary | ICD-10-CM | POA: Diagnosis not present

## 2022-05-01 DIAGNOSIS — M25532 Pain in left wrist: Secondary | ICD-10-CM

## 2022-05-01 DIAGNOSIS — R600 Localized edema: Secondary | ICD-10-CM

## 2022-05-01 DIAGNOSIS — R29898 Other symptoms and signs involving the musculoskeletal system: Secondary | ICD-10-CM

## 2022-05-03 ENCOUNTER — Ambulatory Visit: Payer: No Typology Code available for payment source | Admitting: Physical Therapy

## 2022-05-03 DIAGNOSIS — M25552 Pain in left hip: Secondary | ICD-10-CM | POA: Diagnosis not present

## 2022-05-03 DIAGNOSIS — M25632 Stiffness of left wrist, not elsewhere classified: Secondary | ICD-10-CM

## 2022-05-03 DIAGNOSIS — M6281 Muscle weakness (generalized): Secondary | ICD-10-CM

## 2022-05-03 DIAGNOSIS — R600 Localized edema: Secondary | ICD-10-CM

## 2022-05-03 DIAGNOSIS — R29898 Other symptoms and signs involving the musculoskeletal system: Secondary | ICD-10-CM

## 2022-05-03 DIAGNOSIS — R2689 Other abnormalities of gait and mobility: Secondary | ICD-10-CM

## 2022-05-03 DIAGNOSIS — M25532 Pain in left wrist: Secondary | ICD-10-CM

## 2022-05-03 DIAGNOSIS — M25551 Pain in right hip: Secondary | ICD-10-CM
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
## 2022-05-08 ENCOUNTER — Ambulatory Visit: Payer: No Typology Code available for payment source

## 2022-05-08 DIAGNOSIS — M25632 Stiffness of left wrist, not elsewhere classified: Secondary | ICD-10-CM

## 2022-05-08 DIAGNOSIS — M25552 Pain in left hip: Secondary | ICD-10-CM | POA: Diagnosis not present

## 2022-05-08 DIAGNOSIS — R600 Localized edema: Secondary | ICD-10-CM

## 2022-05-08 DIAGNOSIS — M6281 Muscle weakness (generalized): Secondary | ICD-10-CM

## 2022-05-08 DIAGNOSIS — R2689 Other abnormalities of gait and mobility: Secondary | ICD-10-CM

## 2022-05-08 DIAGNOSIS — R29898 Other symptoms and signs involving the musculoskeletal system: Secondary | ICD-10-CM

## 2022-05-08 DIAGNOSIS — M25551 Pain in right hip: Secondary | ICD-10-CM

## 2022-05-08 DIAGNOSIS — M25532 Pain in left wrist: Secondary | ICD-10-CM
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
## 2022-05-10 ENCOUNTER — Ambulatory Visit: Payer: No Typology Code available for payment source

## 2022-05-15 ENCOUNTER — Ambulatory Visit: Payer: No Typology Code available for payment source | Admitting: Physical Therapy

## 2022-05-25 ENCOUNTER — Encounter: Payer: No Typology Code available for payment source | Admitting: Physical Therapy

## 2022-08-24 ENCOUNTER — Other Ambulatory Visit: Payer: Self-pay | Admitting: Physician Assistant
